# Patient Record
Sex: Female | Born: 1960 | Race: White | Hispanic: No | Smoking: Current every day smoker
Health system: Southern US, Community
[De-identification: ages and names within clinical notes are randomized; demographics above are authoritative.]

## PROBLEM LIST (undated history)

## (undated) DIAGNOSIS — M1990 Unspecified osteoarthritis, unspecified site: Secondary | ICD-10-CM

## (undated) DIAGNOSIS — F32A Depression, unspecified: Secondary | ICD-10-CM

## (undated) DIAGNOSIS — F419 Anxiety disorder, unspecified: Secondary | ICD-10-CM

## (undated) DIAGNOSIS — T8859XA Other complications of anesthesia, initial encounter: Secondary | ICD-10-CM

## (undated) DIAGNOSIS — F329 Major depressive disorder, single episode, unspecified: Secondary | ICD-10-CM

## (undated) DIAGNOSIS — T4145XA Adverse effect of unspecified anesthetic, initial encounter: Secondary | ICD-10-CM

## (undated) HISTORY — PX: DILATION AND CURETTAGE OF UTERUS: SHX78

## (undated) HISTORY — PX: FOOT SURGERY: SHX648

---

## 1998-04-04 ENCOUNTER — Other Ambulatory Visit: Admission: RE | Admit: 1998-04-04 | Discharge: 1998-04-04 | Payer: Self-pay | Admitting: Obstetrics and Gynecology

## 1998-10-24 ENCOUNTER — Inpatient Hospital Stay (HOSPITAL_COMMUNITY): Admission: AD | Admit: 1998-10-24 | Discharge: 1998-10-26 | Payer: Self-pay | Admitting: Obstetrics and Gynecology

## 1998-10-27 ENCOUNTER — Encounter (HOSPITAL_COMMUNITY): Admission: RE | Admit: 1998-10-27 | Discharge: 1999-01-25 | Payer: Self-pay | Admitting: Obstetrics and Gynecology

## 1998-11-28 ENCOUNTER — Other Ambulatory Visit: Admission: RE | Admit: 1998-11-28 | Discharge: 1998-11-28 | Payer: Self-pay | Admitting: Obstetrics and Gynecology

## 1999-01-05 ENCOUNTER — Ambulatory Visit (HOSPITAL_COMMUNITY): Admission: RE | Admit: 1999-01-05 | Discharge: 1999-01-05 | Payer: Self-pay | Admitting: Obstetrics and Gynecology

## 1999-09-07 ENCOUNTER — Emergency Department (HOSPITAL_COMMUNITY): Admission: EM | Admit: 1999-09-07 | Discharge: 1999-09-08 | Payer: Self-pay | Admitting: Emergency Medicine

## 2001-01-01 ENCOUNTER — Other Ambulatory Visit: Admission: RE | Admit: 2001-01-01 | Discharge: 2001-01-01 | Payer: Self-pay | Admitting: Obstetrics and Gynecology

## 2001-05-29 ENCOUNTER — Emergency Department (HOSPITAL_COMMUNITY): Admission: EM | Admit: 2001-05-29 | Discharge: 2001-05-29 | Payer: Self-pay | Admitting: Emergency Medicine

## 2001-05-29 ENCOUNTER — Encounter: Payer: Self-pay | Admitting: Emergency Medicine

## 2001-06-09 ENCOUNTER — Emergency Department (HOSPITAL_COMMUNITY): Admission: EM | Admit: 2001-06-09 | Discharge: 2001-06-09 | Payer: Self-pay | Admitting: Emergency Medicine

## 2001-07-31 ENCOUNTER — Inpatient Hospital Stay (HOSPITAL_COMMUNITY): Admission: EM | Admit: 2001-07-31 | Discharge: 2001-08-04 | Payer: Self-pay | Admitting: Psychiatry

## 2002-02-01 ENCOUNTER — Encounter: Admission: RE | Admit: 2002-02-01 | Discharge: 2002-02-01 | Payer: Self-pay | Admitting: Obstetrics and Gynecology

## 2002-02-01 ENCOUNTER — Encounter: Payer: Self-pay | Admitting: Obstetrics and Gynecology

## 2002-06-15 ENCOUNTER — Other Ambulatory Visit: Admission: RE | Admit: 2002-06-15 | Discharge: 2002-06-15 | Payer: Self-pay | Admitting: Obstetrics and Gynecology

## 2002-07-02 ENCOUNTER — Ambulatory Visit (HOSPITAL_COMMUNITY): Admission: RE | Admit: 2002-07-02 | Discharge: 2002-07-02 | Payer: Self-pay | Admitting: Obstetrics and Gynecology

## 2002-07-02 ENCOUNTER — Encounter (INDEPENDENT_AMBULATORY_CARE_PROVIDER_SITE_OTHER): Payer: Self-pay | Admitting: *Deleted

## 2002-09-22 ENCOUNTER — Emergency Department (HOSPITAL_COMMUNITY): Admission: EM | Admit: 2002-09-22 | Discharge: 2002-09-22 | Payer: Self-pay | Admitting: Emergency Medicine

## 2002-09-22 ENCOUNTER — Encounter: Payer: Self-pay | Admitting: Emergency Medicine

## 2002-09-30 ENCOUNTER — Ambulatory Visit (HOSPITAL_COMMUNITY): Admission: RE | Admit: 2002-09-30 | Discharge: 2002-09-30 | Payer: Self-pay | Admitting: Neurology

## 2002-12-20 ENCOUNTER — Emergency Department (HOSPITAL_COMMUNITY): Admission: EM | Admit: 2002-12-20 | Discharge: 2002-12-20 | Payer: Self-pay | Admitting: Emergency Medicine

## 2003-01-11 ENCOUNTER — Emergency Department (HOSPITAL_COMMUNITY): Admission: EM | Admit: 2003-01-11 | Discharge: 2003-01-11 | Payer: Self-pay | Admitting: Emergency Medicine

## 2003-04-25 ENCOUNTER — Inpatient Hospital Stay (HOSPITAL_COMMUNITY): Admission: EM | Admit: 2003-04-25 | Discharge: 2003-04-26 | Payer: Self-pay | Admitting: Emergency Medicine

## 2003-04-26 ENCOUNTER — Inpatient Hospital Stay (HOSPITAL_COMMUNITY): Admission: EM | Admit: 2003-04-26 | Discharge: 2003-05-01 | Payer: Self-pay | Admitting: Psychiatry

## 2004-04-01 ENCOUNTER — Emergency Department (HOSPITAL_COMMUNITY): Admission: EM | Admit: 2004-04-01 | Discharge: 2004-04-01 | Payer: Self-pay | Admitting: Emergency Medicine

## 2004-05-10 ENCOUNTER — Ambulatory Visit: Payer: Self-pay | Admitting: Psychiatry

## 2004-05-10 ENCOUNTER — Inpatient Hospital Stay (HOSPITAL_COMMUNITY): Admission: EM | Admit: 2004-05-10 | Discharge: 2004-05-17 | Payer: Self-pay | Admitting: Psychiatry

## 2004-05-10 ENCOUNTER — Encounter (HOSPITAL_COMMUNITY): Payer: Self-pay | Admitting: Psychiatry

## 2004-07-26 ENCOUNTER — Encounter: Payer: Self-pay | Admitting: Emergency Medicine

## 2004-07-27 ENCOUNTER — Inpatient Hospital Stay (HOSPITAL_COMMUNITY): Admission: EM | Admit: 2004-07-27 | Discharge: 2004-08-02 | Payer: Self-pay | Admitting: Psychiatry

## 2004-07-27 ENCOUNTER — Ambulatory Visit: Payer: Self-pay | Admitting: Psychiatry

## 2004-09-18 ENCOUNTER — Ambulatory Visit: Payer: Self-pay | Admitting: Psychiatry

## 2004-09-19 ENCOUNTER — Inpatient Hospital Stay (HOSPITAL_COMMUNITY): Admission: RE | Admit: 2004-09-19 | Discharge: 2004-09-23 | Payer: Self-pay | Admitting: Psychiatry

## 2004-11-08 ENCOUNTER — Emergency Department (HOSPITAL_COMMUNITY): Admission: EM | Admit: 2004-11-08 | Discharge: 2004-11-08 | Payer: Self-pay | Admitting: Emergency Medicine

## 2005-01-22 ENCOUNTER — Ambulatory Visit: Payer: Self-pay | Admitting: Psychiatry

## 2005-01-22 ENCOUNTER — Inpatient Hospital Stay (HOSPITAL_COMMUNITY): Admission: EM | Admit: 2005-01-22 | Discharge: 2005-01-24 | Payer: Self-pay | Admitting: Psychiatry

## 2005-03-07 ENCOUNTER — Encounter: Payer: Self-pay | Admitting: Emergency Medicine

## 2005-03-07 ENCOUNTER — Inpatient Hospital Stay (HOSPITAL_COMMUNITY): Admission: RE | Admit: 2005-03-07 | Discharge: 2005-03-11 | Payer: Self-pay | Admitting: Psychiatry

## 2005-03-08 ENCOUNTER — Ambulatory Visit: Payer: Self-pay | Admitting: Psychiatry

## 2005-06-03 ENCOUNTER — Ambulatory Visit: Payer: Self-pay | Admitting: *Deleted

## 2005-06-03 ENCOUNTER — Inpatient Hospital Stay (HOSPITAL_COMMUNITY): Admission: EM | Admit: 2005-06-03 | Discharge: 2005-06-06 | Payer: Self-pay | Admitting: *Deleted

## 2005-07-21 ENCOUNTER — Encounter: Payer: Self-pay | Admitting: Emergency Medicine

## 2005-07-21 ENCOUNTER — Inpatient Hospital Stay (HOSPITAL_COMMUNITY): Admission: EM | Admit: 2005-07-21 | Discharge: 2005-07-25 | Payer: Self-pay | Admitting: Psychiatry

## 2005-07-22 ENCOUNTER — Ambulatory Visit: Payer: Self-pay | Admitting: Psychiatry

## 2005-07-26 ENCOUNTER — Emergency Department (HOSPITAL_COMMUNITY): Admission: EM | Admit: 2005-07-26 | Discharge: 2005-07-26 | Payer: Self-pay | Admitting: Emergency Medicine

## 2006-07-27 ENCOUNTER — Emergency Department (HOSPITAL_COMMUNITY): Admission: EM | Admit: 2006-07-27 | Discharge: 2006-07-28 | Payer: Self-pay | Admitting: Emergency Medicine

## 2010-03-18 ENCOUNTER — Encounter: Payer: Self-pay | Admitting: Obstetrics and Gynecology
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
## 2010-07-13 NOTE — H&P (Signed)
   [name], [name]                        ACCOUNT NO.:  [id number]   MEDICAL RECORD NO.:  [id number]                   PATIENT TYPE:  AMB   LOCATION:  SDC                                  [facility]ACILITY:  [facility]   PHYSICIAN:  Lenoard Aden, M.D.             DATE OF BIRTH:  [birth year]   DATE OF ADMISSION:  07/02/2002  DATE OF DISCHARGE:                                HISTORY & PHYSICAL   CHIEF COMPLAINT:  A 50 year old white female (G10, P1-1-8-2) status post  tubal ligation in 2000, with worsening dysfunctional uterine bleeding,  questionable endometrial mass on saline sonohysterography performed  06/01/02.   PAST MEDICAL HISTORY:  Remarkable for five miscarriages, two abortions, one  C-section, one vaginal delivery and cryosurgery in 1985.  She has had  laparoscopic tubal ligation in 2001.  No other medical or surgical  hospitalizations.   SOCIAL HISTORY:  She is a smoker.   MEDICATIONS:  She takes no medications at this time.   PHYSICAL EXAMINATION:  GENERAL:  She is a well developed, well nourished  white female, in no apparent distress.  HEENT:  Normal.  LUNGS:  Clear.  HEART:  Regular rate and rhythm.  ABDOMEN:  Soft, nontender.  PELVIC:  Reveals an anteflexed uterus and no adnexal masses.   IMPRESSION:  Perimenopausal dysfunctional uterine bleeding, with  questionable structural mass.   PLAN:  Proceed with diagnostic hysteroscopy, resectoscopic  myomectomy/polypectomy.  The risks of anesthesia, infection, bleeding,  injury to abdominal organs and need for repair was discussed.  Delay versus  the immediate complications to include bowel or bladder injury are noted;  inability to cure bleeding is discussed.  The patient acknowledges and  wishes to proceed.                                                Lenoard Aden, M.D.    RJT/MEDQ  D:  07/02/2002  T:  07/02/2002  Job:  [id number]
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
## 2010-07-13 NOTE — Discharge Summary (Signed)
[facility]  Patient:    LAJUANNA, POMPA Visit Number: [id number] MRN: [id number]          Service Type: PSY Location: 300 0301 01 Attending Physician:  Rachael Fee Dictated by:   Reymundo Poll Dub Mikes, M.D. Admit Date:  07/31/2001 Discharge Date: 08/04/2001                             Discharge Summary  CHIEF COMPLAINT AND PRESENTING ILLNESS:  This was the first admission to [facility] for this 50 year old female, married, admitted voluntarily.  Referred after her husband found her making cuts on her left wrist.  The patient remembers little of what happened.  She remembers drinking large amounts of alcohol since age 70, sobriety during pregnancies.  She cites anxiety especially in social situations as one of her primary stressors. Reports that she does feel the need counters and clean the house frequently, almost obsessively.  Does feel frustration, hopelessness, despair regarding her inability to stop drinking.  PAST PSYCHIATRIC HISTORY:  No current outpatient treatment, has history of 2 prior detox admissions, one in 1989 at [place], one in April at [facility], has gone to AA, treated with Zoloft.  ALCOHOL AND DRUG HISTORY:  The patient did take 2 Xanax the day before the admission but does not take it routinely.  MEDICAL HISTORY:  Carpal tunnel syndrome.  MEDICATIONS:  Advil as needed.  PHYSICAL EXAMINATION:  Performed, failed to show any acute findings.  MENTAL STATUS EXAMINATION:  Reveals a petite female, generally healthy, disheveled, has considerable periorbital edema, quite puffy faced, otherwise fully alert, in no acute distress, with a blunt affect.  Cooperative, speech is normal and relevant.  Mood is anxious, depressed, hopeless, somewhat guilty about her drinking.  Thought processes are logical and goal directed.  Does minimize her alcohol use, thinks that she still runs a good household even though she  drinks.  ADMITTING  DIAGNOSES: Axis I:    1. Alcohol dependence.            2. Anxiety disorder not otherwise specified. Axis II:   No diagnosis. Axis III:  Elevated liver enzymes. Axis IV:   Moderate. Axis V:    Global assessment of function upon admission 28, highest            global assessment of function in past year 60.  LABORATORY WORK-UP:  Thyroid profile was within normal limits.  COURSE IN THE HOSPITAL:  She was admitted and started on intensive individual and group psychotherapy.  She was detoxified using phenobarbital.  She was placed on Paxil as well as Seroquel and trazodone.  She was detoxed uneventfully, did not sleep with Ambien so she was given trazodone which was more successful.  She continued to work on herself, identifying the triggers, working on a relapse prevention program, worked on the anxiety disorder.  On June 10 it was felt that she had obtained full benefit from the hospitalization, fully detoxed, committed to recovery, had tolerated Paxil well, willing and motivated to continue outpatient treatment.  DISCHARGE  DIAGNOSES: Axis I:    1. Alcohol dependence.            2. Anxiety disorder not otherwise specified. Axis II:   No diagnosis. Axis III:  Elevated liver enzymes. Axis IV:   Moderate. Axis V:    Global assessment of function upon discharge 55.  DISCHARGE MEDICATIONS: 1. Paxil CR 25 mg daily. 2.  Seroquel 25 at bedtime. 3. Trazodone 100 as needed for sleep.  DISPOSITION:  Follow up at [facility]. Dictated by:   Reymundo Poll Dub Mikes, M.D. Attending Physician:  Rachael Fee DD:  09/02/01 TD:  09/05/01 Job: [id number]

## 2010-07-13 NOTE — Op Note (Signed)
   [name], [name]                        ACCOUNT NO.:  [id number]   MEDICAL RECORD NO.:  [id number]                   PATIENT TYPE:  AMB   LOCATION:  SDC                                  [facility]ILITY:  [facility]   PHYSICIAN:  Lenoard Aden, M.D.             DATE OF BIRTH:  08/08/1960   DATE OF PROCEDURE:  07/02/2002  DATE OF DISCHARGE:                                 OPERATIVE REPORT   PREOPERATIVE DIAGNOSIS:  Perimenopausal dysfunctional uterine bleeding with  questionable structural endometrial lesion.   POSTOPERATIVE DIAGNOSIS:  Just fibroids x2.   OPERATION/PROCEDURE:  1. Diagnostic hysteroscopy.  2. Resectoscopic myomectomy.  3. Dilatation and curettage.   SURGEON:  Lenoard Aden, M.D.   ANESTHESIA:  General.   ESTIMATED BLOOD LOSS:  Less than 50 mL.   COMPLICATIONS:  None.   DRAINS:  None.   COUNTS:  Correct.   CONDITION:  The patient went to recovery in good condition.   SPECIMENS:  Endometrial curettings and submucous fibroids to pathology.   DESCRIPTION OF PROCEDURE:  After being apprised of the risks of anesthesia,  infection, bleeding, uterine perforation, possible injury to abdominal  organs and need for repair, the patient was brought to the operating room  where she was administered general anesthesia without complications, prepped  and draped in the usual sterile fashion, catheterized until the bladder was  empty.  Examination under anesthesia revealed a small anteflexed uterus and  no adnexal masses.  Dilute Pitressin solution 10 in 100 mL was placed at 3  and 9 o'clock at the cervicovaginal junction, approximately 16 mL total.  The uterus is easily dilated up to a #33 Pratt dilator and hysteroscope  placed.  Visualization reveals anterior bulging and thickening of the  anterior uterine wall __________ the fundus and posterior wall as well.  These areas were resected down to the myometrium hemostatically using a  double angle  loop.  Bilateral  tubal ostia are visualized.  No evidence of further polyps  or endometrial masses at this time.  D&C is performed.  Re-visualization  reveals an empty cavity.  All instruments are removed.  Deficit of 32 mL.  The patient tolerated the procedure well and was transferred to recovery in  good condition.                                               Lenoard Aden, M.D.    RJT/MEDQ  D:  07/02/2002  T:  07/03/2002  Job:  [id number]
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
## 2010-07-13 NOTE — Discharge Summary (Signed)
[name], [name]              ACCOUNT NO.:  [id number]   MEDICAL RECORD NO.:  [id number]          PATIENT TYPE:  IPS   LOCATION:  0303                          [facility]  [facility]   PHYSICIAN:  Anselm Jungling, MD  DATE OF BIRTH:  1960-12-06   DATE OF ADMISSION:  07/21/2005  DATE OF DISCHARGE:  07/25/2005                                 DISCHARGE SUMMARY   IDENTIFYING DATA AND REASON FOR ADMISSION:  The patient is a 50 year old  single white female admitted for treatment of alcohol dependence and mood  disorder.  Her last alcohol intake was on the day prior to admission, when  she drank quite heavily.  Upon admission she felt agitated, and tremulous.  She had recently been on a psychotropic regimen including Lexapro, Lamictal,  Seroquel and Campral, but had stopped taking these 2-3 weeks prior to  admission, because she had started drinking again and thought it was  dangerous to combine of.  She had been hospitalized here psychiatrically  last year for similar reasons.  Please refer to the admission note for  further details pertaining to symptoms, circumstances and history that led  to her hospitalization.  She was given an initial Axis I diagnoses of  alcohol dependence, and history of mood disorder.   MEDICAL AND LABORATORY:  The patient was medically and physically assessed  by the psychiatric nurse practitioner.  She is essentially in good health  without any active or chronic medical problems.  There were no significant  medical issues during this brief inpatient psychiatric stay.   HOSPITAL COURSE:  The patient was admitted to the adult inpatient  psychiatric service.  She was placed on a Librium detoxification protocol.  She was restarted on her usual Lexapro, Lamictal, Seroquel and Campral.  However, during the course of her stay, we determined that Seroquel was not  appearing to be necessary, and it was discontinued.  She tolerated her  medications well.  The  detoxification proceeded uneventfully.   During her stay, she verbalized strongly her desire to get into some form of  solid 12-step recovery on a lifetime basis.   She and her family worked together to try to identify a long-term  residential treatment program called [facility], that she hopes to be able  to enter following her discharge.   Her mood was moderately depressed, but appropriate to her situation and she  was absent suicidal ideation during her entire inpatient stay.   She appeared to be complete with respect to the alcohol detoxification  process by the fifth hospital day and was discharged.   AFTERCARE:  The patient was to follow-up for outpatient treatment at the  [facility] with an appointment on the day following discharge, July 26, 2005.  In addition, at the time of discharge she was making application to  the [facility] residential program.   DISCHARGE MEDICATIONS:  Campral 666 mg t.i.d., Lexapro 20 mg daily, and  Lamictal 25 mg daily.   DISCHARGE DIAGNOSES:  AXIS I: Alcohol dependence, early remission,  mood  disorder not otherwise specified.  AXIS II: Deferred.  AXIS III: No acute or chronic illnesses.  AXIS IV: Stressors severe.  AXIS V: Global assessment of functioning on discharge 65.           ______________________________  Anselm Jungling, MD  Electronically Signed     SPB/MEDQ  D:  07/26/2005  T:  07/26/2005  Job:  [id number]
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
## 2010-07-13 NOTE — H&P (Signed)
[facility]  Patient:    [name], [name] Visit Number: [id number] MRN: [id number]          Service Type: PSY Location: 300 0301 01 Attending Physician:  Rachael Fee Dictated by:   Young Berry Scott, R.N. N.P. Admit Date:  07/31/2001                     Psychiatric Admission Assessment  DATE OF ADMISSION:  July 31, 2001  DATE OF ASSESSMENT:  July 31, 2001, at 8 a.m.  PATIENT IDENTIFICATION:  This is a 50 year old Caucasian female who is married, voluntary admission.  HISTORY OF PRESENT ILLNESS:  This patient was referred by the emergency room after her husband found her making cuts on her left wrist.  The patient remembers little of what happened yesterday.  She reports drinking large amounts of alcohol since age 55 with sobriety during her pregnancies.  She cites anxiety, especially in social situations, as one of her primary stressors and reports that she does feel the need to clean counters and clean the house frequently, almost obsessively, but denies any ritual behaviors. She endorses frustration, hopelessness, and despair regarding her inability to stop drinking.  Typically, she begins drinking at 4 oclock in the morning and sips alcohol constantly all day long until she passes out at night and then awakens at 4 oclock in the morning feeling anxious and sweaty.  The patient is also concerned about her ability to parent effectively, recognizing that she is not always there for her 81-year-old and 22-year-old daughters as she needs to be.  She denies any homicidal ideation, denies any auditory or visual hallucinations.  She persists with some vague suicidal ideation but with no specific intent or plan.  PAST PSYCHIATRIC HISTORY:  The patient has no current outpatient treatment. She has a history of two prior detoxification admissions, one in 1989 at the [facility], and one in April 2003 at [facility].  She has attended [facility] in the past but has never obtained a sponsor and has not attended consistently.  She was treated with Zoloft for her anxiety at one point in the past but reports that she was drinking so much at the time, she is not sure if it was effective or not, and then she stopped it on her own. Her longest period sober since age 7 is one and one half years.  The patient denies any other substance abuse and denies any previous history of suicide attempts.  She did take some Xanax yesterday which she obtained from her sister and took that in addition with the alcohol and passed out; hence, she has little memory of what happened yesterday.  SUBSTANCE ABUSE HISTORY:  Noted above.  The patient did take two Xanax, she reports, yesterday but does not routinely use benzodiazepines.  PAST MEDICAL HISTORY:  The patient is followed by Dr. Billy Coast who is her obstetrician at [facility].  Medical problems include carpal tunnel syndrome in her right wrist.  Past medical history is remarkable for no hospitalizations.  Surgeries include a bilateral tubal ligation in 2000, a cesarean section once, and some toe surgery.  She denies any past history of severe head trauma, no history of seizures.  Does have positive history of blackouts.  MEDICATIONS:  Advil as needed for her carpal tunnel syndrome.  DRUG ALLERGIES:  None.  PHYSICAL EXAMINATION:  GENERAL:  The patients physical examination was negative, done in the emergency room by Dr. Mancel Bale.  VITAL SIGNS:  On admission to the unit: Temperature 98.5, pulse 104, respirations 70, blood pressure 139/88.  She was 116 pounds on admission and is approximately 5 feet 2 inches tall.  LABORATORY DATA:  Review of labs reveal that her CBC is normal with WBC 5.8, hemoglobin 13.4, hematocrit 39.1, MCV 98.2, RDW 13.3, platelets 227. Chemistry reveals sodium slightly elevated at 147, potassium 3.8.  Other lytes are normal; BUN 9, creatinine 0.7.  Total  bilirubin is 0.5, SGOT elevated at 46, SGPT at 35.  Urine pregnancy test is negative.  Urine drug screen: Positive for benzodiazepines.  Alcohol level in the emergency room was initially 355 on admission, later decreased eight hours later was 153. Urinalysis is normal.  SOCIAL HISTORY:  The patient is originally from [place], [place]; most recently moved here in 1999 to be near additional family members.  She has a high school education and one of community college.  She works as a Futures trader.  She has been married for 12 years.  She has 56-year-old and 61-year-old daughters. She currently lives at home with her husband and two daughters in [place]. Her parents and siblings live nearby and are generally supportive.  FAMILY HISTORY:  Brother with a history of alcohol abuse and a sister with a history of benzodiazepine abuse.  MENTAL STATUS EXAMINATION:  This is a petite female who is generally healthy in appearance.  She is disheveled and has considerable periorbital edema and quite a puffy face but otherwise is fully alert in no acute distress with a blunted affect and a subdued manner.  She is cooperative.  Speech is normal and relevant.  Mood is anxious, depressed, hopeless, and somewhat guilty about her drinking.  Thought process is logical and goal directed.  She does minimize her ETOH use and states that she still runs a good household even though she drinks and only sips.  She has a considerable denial component and finds it difficult to believe that detoxification is going to take four or five days.  Cognitive: Intact and oriented x 3.  Intelligence is average to above average.  Insight: Poor.  Impulse control and judgment: Fair.  ADMISSION DIAGNOSES: Axis I:    1. Ethyl alcohol abuse and dependence.            2. Anxiety disorder, not otherwise specified. Axis II:   Deferred. Axis III:  Elevated liver enzymes. Axis IV:   Moderate to severe problems with the primary support  group, being            the impact of ethyl alcohol on her marriage and on her parenting.  Axis V:    Current 28, past year 78.  INITIAL PLAN OF CARE:  Plan is to voluntarily admit the patient to stabilize her and detoxify her from alcohol with a goal of alleviating her suicidal ideation, giving her a safe detoxification within five days, and alleviating her intense feelings of anxiety to allow her to function in her previous activities of daily living.  We are going to ask the case manager to schedule a family session with her husband to enlist his support and hear his concerns. We have elected to start her on a phenobarbital protocol and she was given a loading dose of 260 mg and will be on a tapering oral dose from there.  She will also receive multivitamin and thiamine daily.  We are going to check a thyroid panel on her since that has not been done and she  does have a family history of thyroid disorder.  We will consider starting her on Paxil 12.5 mg daily.  ESTIMATED LENGTH OF STAY:  Six days. Dictated by:   Young Berry Scott, R.N. N.P. Attending Physician:  Rachael Fee DD:  07/31/01 TD:  07/31/01 Job: [phone]
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
## 2010-07-13 NOTE — H&P (Signed)
[name], DEGNER NO.:  [id number]   MEDICAL RECORD NO.:  [id number]          PATIENT TYPE:  IPS   LOCATION:  0501                          FACILITY:  [facility]   PHYSICIAN:  Jeanice Lim, M.D. DATE OF BIRTH:  [birth year]   DATE OF ADMISSION:  03/07/2005  DATE OF DISCHARGE:                         PSYCHIATRIC ADMISSION ASSESSMENT   IDENTIFYING INFORMATION:  This is a 50 year old widowed white female  voluntarily admitted on March 07, 2005.   HISTORY OF PRESENT ILLNESS:  The patient presents with a history of alcohol  dependence.  The patient has been drinking a fifth of liquor for the past  few days.  The patient states she fell and sustained a laceration to her  eyebrow.  Her mother was very concerned and wanted her to be detoxed.  The  patient has had multiple detoxes prior.  The patient feels that she is a  burden to her family.  Her mother has had recent open heart surgery.  The  patient currently lives with the mother.  The patient's two children also  stay with her parents.  The patient also has been using Xanax.  She has been  noncompliant with the medications.  She was reporting some side effects,  specifically diarrhea with her Lexapro. The patient states that she knows  what she needs to do in regards to her alcohol intake.  She is currently  denying any suicidal ideation.   PAST PSYCHIATRIC HISTORY:  The patient has had several admissions to  [facility] for alcohol abuse/dependence.  The patient was here  in October of 2006 and was attending the [facility] for follow-up care.   SOCIAL HISTORY:  This is a 50 year old widowed white female.  She has two  children.  Husband had hung himself several years ago.  She has a court date  pending on March 19, 2005 for her third DUI.  Feels that she may be going  to prison for this DUI.   FAMILY HISTORY:  Unclear.   ALCOHOL/DRUG HISTORY:  The patient also reports a history of seizures  and  has been using Xanax.   PRIMARY CARE PHYSICIAN:  Unclear.   MEDICAL PROBLEMS:  None.   MEDICATIONS:  Has been using Xanax, possible 1 mg q.i.d.  Was on Lexapro and  Seroquel.  Again, has been noncompliant.  The patient was on Seroquel 200 mg  at bedtime.   ALLERGIES:  No known allergies.   PHYSICAL EXAMINATION:  The patient was assessed at [facility].  She does present with a laceration to her left eyebrow, which  was stitched.  There was no bleeding or redness noted.  She is not showing  any signs of tremors at this time.  Her temperature is 97.8, respiratory  rate 12, blood pressure 123/77.   LABORATORY DATA:  Her alcohol level, on admission to the emergency room, was  360 down to 292 prior to transfer.  Urine drug screen was positive for  benzodiazepines.  TSH is 2.913.  CMET is within normal limits.   MENTAL STATUS EXAM:  She is fully  alert, cooperative, lying in the bed.  She  has fair eye contact.  Casually dressed.  Her speech is clear, normal pace  and tone.  The patient feels very frustrated and hopeless over her  situation.  Her affect is flat, although polite and pleasant.  Thought  processes are coherent.  No evidence of psychosis.  Cognitive function is  intact.  Memory is good.  Judgment is poor.  Insight is poor.  Poor impulse  control.   DIAGNOSES:  AXIS I:  Major depressive disorder, recurrent.  Rule out alcohol-  induced mood disorder.  Alcohol dependence.  Rule out bipolar disorder.  AXIS II:  Deferred.  AXIS III:  None.  AXIS IV:  Severe (psychosocial stressors related to legal system,  psychosocial problems related to grief, problems with primary support group,  housing).  AXIS V:  Current 30; past year 70.   PLAN:  Detox the patient.  Work on relapse prevention.  Discussion of Xanax  was done with the patient.  The patient is agreeable and aware that we will  not be resuming her Xanax.  We will encourage fluids.  The patient  is to  attend all individual and group therapy.  The patient will possibly need a  long-term rehab program to be medication compliant.  Will consider having a  family session with parents.   TENTATIVE LENGTH OF STAY:  Four to six days.      Landry Corporal, N.P.      Jeanice Lim, M.D.  Electronically Signed    JO/MEDQ  D:  03/08/2005  T:  03/08/2005  Job:  [id number]
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
## 2010-07-13 NOTE — H&P (Signed)
[name], [name]                        ACCOUNT NO.:  [id number]   MEDICAL RECORD NO.:  [id number]                   PATIENT TYPE:  IPS   LOCATION:  0506                                 FACILITY:  [facility]   PHYSICIAN:  Geoffery Lyons, M.D.                   DATE OF BIRTH:  1960/07/21   DATE OF ADMISSION:  04/26/2003  DATE OF DISCHARGE:                         PSYCHIATRIC ADMISSION ASSESSMENT   IDENTIFYING INFORMATION:  The patient is a 50 year old married white female  voluntarily admitted on April 26, 2003.   HISTORY OF PRESENT ILLNESS:  The patient presents with a history of a  suicide attempt where the patient overdosed on Xanax, Seroquel, trazodone,  and codeine.  She believes it was this past weekend on Saturday.  The  patient is a transfer from [facility] after the patient was admitted for  overdose.  The patient's intention with her overdosed was that she was  hoping to end the pain.  She has been having conflict with her husband.  She states her intention was to end her life.  She states she also left a  note to all.  The patient states she has been abusing alcohol, drinking  all day, sipping on alcohol throughout the day.  Stressors: The patient  states her husband has been gambling.  Her sleep has been decreased.  Her  appetite has been decreased.  She denies any psychotic symptoms.  The  patient is motivated to stay well this time and not relapse on alcohol.   PAST PSYCHIATRIC HISTORY:  This is the second hospitalization at [facility].  She has been detoxified five to six times in the past.  She  was in a 28-day program at [facility].  Her last admission to  [facility] was in June 2003 for similar complaints.  She was  seeing a psychiatrist at [facility], Dr. Jeanie Sewer.  She was hospitalized at [facility] in the past for a suicide attempt in November 2004 where  she overdosed on several pills and had a five day hospital stay.   SUBSTANCE ABUSE HISTORY:  The patient smokes.  She drinks vodka from  morning to night for the past 30 years.  She began drinking around the age  of 49.  Longest history of sobriety has been a year and a half.  The patient  reports positive blackouts, no seizure activity.   PAST MEDICAL HISTORY:  Primary care Lorriann Hansmann: Lenoard Aden, M.D.  Medical problems: None.   MEDICATIONS:  She is taking none currently.  She has been on Zoloft,  Effexor, Lexapro, and Paxil in the past.   PHYSICAL EXAMINATION:  GENERAL:  Physical examination was done while the  patient was admitted for overdose at [facility], which was reviewed.  The  patient is a well nourished female, petite.  VITAL SIGNS:  Vital signs are stable.  Temperature 98.2, heart rate  74,  respirations 18, blood pressure 105/67.  NEUROLOGIC:  Nonfocal neurologic findings, no tremors.   LABORATORY DATA:  CBC is within normal limits.  Potassium is 3.7.  Blood  sugar is 103.  AST is elevated at 50.  Urine drug screen was positive for  benzodiazepines, positive for opiates.  CT scan of the head was negative.   SOCIAL HISTORY:  She is a 50 year old married white female married for 14  years, second.  She has two children ages 51 and 70.  She lives with her  husband and children.  She stays at home.  She has two DUIs.  No current  legal problems.   FAMILY HISTORY:  Grandmother and grandfather on the mother's side with a  history of alcohol.  Her uncle also has problems with alcohol.   MENTAL STATUS EXAM:  She is an alert, middle-aged female, cooperative, fair  eye contact.  Speech is clear.  The patient feels sad, nervous, and scared  today.  Affect is anxious.  Thought processes are coherent; no evidence of  psychosis.  Cognitive functioning: Intact.  Memory is good.  Judgment is  poor.  Insight is limited.   ADMISSION DIAGNOSES:   AXIS I:  1. Major depressive disorder.  2. Alcohol dependence.  3. Rule out bipolar disorder.    AXIS II:  Deferred.   AXIS III:  None.   AXIS IV:  Problems with primary support group, other psychosocial problems  related to parenting issues.   AXIS V:  Current is 35, this past year is 79.   INITIAL PLAN OF CARE:  Plan is a voluntary admission to [facility] for intentional overdose and alcohol dependence.  Contract for  safety.  Will stabilize and detoxify with Librium protocol.  Will encourage  fluids.  Will initiate antidepressant.  Medication compliance was discussed.  Have Seroquel available for sleep and mood lability.  Will have a family  session with husband for support.  Discussion of tobacco use and alcohol  abuse was done with the patient.  Relapse prevention while the patient is  hospitalized.  The patient is to follow up with AA and to remain alcohol  free.   ESTIMATED LENGTH OF STAY:  Four to five days.     Landry Corporal, N.P.                       Geoffery Lyons, M.D.    JO/MEDQ  D:  04/27/2003  T:  04/27/2003  Job:  [id number]

## 2010-07-13 NOTE — Discharge Summary (Signed)
NAMEKI, LUCKMAN                        ACCOUNT NO.:  [id number]   MEDICAL RECORD NO.:  [id number]                   PATIENT TYPE:  IPS   LOCATION:  0506                                 FACILITY:  [facility]   PHYSICIAN:  Geoffery Lyons, M.D.                   DATE OF BIRTH:  1960/09/01   DATE OF ADMISSION:  04/26/2003  DATE OF DISCHARGE:  05/01/2003                                 DISCHARGE SUMMARY   CHIEF COMPLAINT AND PRESENT ILLNESS:  This was the second admission to [facility] for this 49 year old married white female voluntarily  admitted.  History of suicide attempt where the patient overdosed on Xanax,  Seroquel, trazodone and codeine the past weekend, on Saturday.  Transferred  from [facility] after the patient was admitted for an overdose, hoping to  end the pain.  Having conflict with her husband.  The intention was to end  her life.  Also left a note to all.  Has been abusing alcohol, drinking  all day, sipping on alcohol throughout the day.  Her husband has been  gambling.  Has had decreased sleep, decreased appetite, no psychotic  symptoms.   PAST PSYCHIATRIC HISTORY:  Second time at [facility].  Detoxified 5-6  times in the past, 28-day program at [facility].  She was admitted in  June of 2003 at [facility] for similar complaint.  Seen a  psychiatrist at [facility], Dr. Betti Cruz.  Hospitalized at [facility] in  the past.   ALCOHOL/DRUG HISTORY:  Drinks vodka from morning until night, for the past  30 years, drinking around age 50.  Longest history of sobriety a year and a  half.  Blackouts.  No seizure activities.   PAST MEDICAL HISTORY:  Denies history of any major medical condition.   MEDICATIONS:  None currently.  Has been on Zoloft, Effexor, Lexapro and  Paxil.   PHYSICAL EXAMINATION:  Performed and failed to show any acute findings.   LABORATORY DATA:  CBC within normal limits.  Potassium 3.7.  Blood sugar  103.  SGOT 50.   Urine drug screen positive for benzodiazepines and opiates.  CT scan of head was negative.   MENTAL STATUS EXAM:  Alert, middle-aged, female.  Cooperative.  Fair eye  contact.  Speech was clear.  The patient feels sad, nervous, and scared.  Affect was anxious.  Thought processes are coherent.  No evidence of  psychosis.  Cognition well-preserved.   ADMISSION DATE:   AXIS I:  1. Major depressive disorder.  2. Alcohol dependence.   AXIS II:  No diagnosis.   AXIS III:  No diagnosis.   AXIS IV:  Moderate.   AXIS V:  Global Assessment of Functioning upon admission 35; highest Global  Assessment of Functioning in the last year 65.   HOSPITAL COURSE:  She was admitted and started intensive individual and  group psychotherapy.  She was detoxified with Librium.  She was given some  Seroquel and started on Zoloft.  Zoloft was increased to 25 mg twice a day.  She was given Seroquel 25 mg in the morning and 100 mg at night.  Then she  was started on Wellbutrin XL 150 mg in the morning and Seroquel was  increased to 150 mg at bedtime.  Endorsed persistent depression as well as  alcohol dependence.  Claimed that when she was still sober, she was still  depressed.  Sense of hopelessness, helplessness, ___________ also an issue.  She did tolerate the Zoloft, started the Wellbutrin.  She claimed that the  depression set the stage for the relapse.  Endorsing overwhelmed.  There was  some underlying withdrawal.  Family session with the husband.  She was going  to return home and follow up with [facility].  She overall felt better.  The next  two days, she got more stable.  Her mood improved.  Her affect became  brighter.  On May 01, 2003, she was in full contact with reality.  Appears  depressed, mild range affect.  No suicidal ideation.  No homicidal ideation.  No psychosis.  Cognition well-preserved.   DISCHARGE DIAGNOSES:   AXIS I:  1. Major depression.  2. Alcohol dependence.   AXIS II:  No  diagnosis.   AXIS III:  No diagnosis.   AXIS IV:  Moderate.   AXIS V:  Global Assessment of Functioning upon discharge 50.   DISCHARGE MEDICATIONS:  1. Zoloft 50 mg per day.  2. Wellbutrin XL 150 mg in the morning.  3. Seroquel 100 mg, 1/2 in the morning and 1-1/2 at bedtime.   FOLLOW UP:  [facility].                                               Geoffery Lyons, M.D.    IL/MEDQ  D:  05/25/2003  T:  05/28/2003  Job:  [id number]
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
## 2010-07-13 NOTE — Discharge Summary (Signed)
[name], [name] NO.:  [id number]   MEDICAL RECORD NO.:  [id number]          PATIENT TYPE:  IPS   LOCATION:  0306                          FACILITY:  [facility]   PHYSICIAN:  Jeanice Lim, M.D. DATE OF BIRTH:  September 11, 1960   DATE OF ADMISSION:  01/22/2005  DATE OF DISCHARGE:  01/24/2005                                 DISCHARGE SUMMARY   IDENTIFYING DATA:  This is a 50 year old single Caucasian female,  voluntarily admitted, [facility]  with alcohol dependence and depression.  She reported remaining sober  throughout the summer for 3 months after her last admission on July 26  through 30, then gradually resumed sipping at alcohol, thinking that she  was able to control it, depressed secondary to death of husband and was  facing a court date.  History of 3 DWIs, currently charged with driving  while license revoked.  Compulsive shopping at times, history of some mood  instability.   PAST PSYCHIATRIC HISTORY:  Had counseling at [facility], fifth [facility] admission, positive history of overdosing and self mutilation.  Period of sobriety was quite impressive for the patient.  She clearly made  progress after her last discharge despite this relapse and her current state  at the time of admission.   MEDICATIONS:  Xanax and Lexapro, which were discontinued.  Had been using  Xanax 4 mg a day, 2 mg in morning and at night, and Seroquel 100 mg daily.   DRUG ALLERGIES:  CODEINE.   PHYSICAL AND NEUROLOGICAL EXAMINATION:  Within normal limits.  The patient  did complain of loose stools, upset stomach, muscle pain, and rapid  thoughts.   MENTAL STATUS EXAM:  Fully alert, fine tremor, anxious, decreased  productivity, soft tone, tremulous, anxious, fidgety, feeling guilty with  lots of shame regarding relapse in drinking, although proud of her period of  sobriety, which was validated.  Thought process distracted,  anxious, passive  suicidal ideation, cognitively intact.  Judgment and insight impaired.   ADMISSION DIAGNOSES:  AXIS I:  Major depressive disorder, recurrent, severe,  rule out bipolar II disorder, and alcohol dependence, possible alcohol-  induced mood disorder superimposed on bipolar II disorder.  AXIS II:  Deferred.  AXIS III:  None.  AXIS IV:  Severe stressors with legal issues related to DWIs, financial  stress and limited support system.  AXIS V:  30/55-60.   HOSPITAL COURSE:  The patient was admitted and ordered routine p.r.n.  medications, underwent further monitoring, and was encouraged to participate  in individual, group and milieu therapy, was initially started on Risperdal  for racing thoughts, pressured speech, and Librium detox protocol.  The  patient was monitored for safety and was optimized on medications and  discharged in improved condition with no acute withdrawal symptoms,  significantly appearing to have improved mood, improved insight regarding  the importance of relapse prevention plan and sobriety, and was given  medication education and discharged on:  1.  Librium 25 mg at 8 p.m. for 1 day and then stop.  2.  Trazodone 100 mg  at 8 p.m.  3.  Paxil CR 12.5 mg in the morning.   The patient was to follow up at the [facility] with Dr. Gwyndolyn Kaufman and Hal Neer, where she had greatly benefitted from and this was in part the  reason that she was able to remain sober for the past 3 months.  She is on  some form of probation which requires treatment and the fact that she  relapsed made her hesitant to inform the [facility] because she thought  this might extend her probation period.  However, the patient was advised  and encouraged to let the [facility] know and be open since she was doing  so much better and she clearly was benefiting from going to the [facility] and it would not hurt her to continue to have to go since she has a  very serious  addiction which makes her dangerous when she relapses.  The  patient was discharged in improved condition with no risk issues, with  improved judgment and insight.  Prognosis is much better than it was a year  ago and there are no risk issues at the time of discharge.   DISCHARGE DIAGNOSES:  AXIS I:  Major depressive disorder, recurrent, severe,  rule out bipolar II disorder, and alcohol dependence, possible alcohol-  induced mood disorder superimposed on bipolar II disorder.  AXIS II:  Deferred.  AXIS III:  None.  AXIS IV:  Severe stressors with legal issues related to DWIs, financial  stress and limited support system.  AXIS V:  Global assessment of functioning on discharge was 55-60.      Jeanice Lim, M.D.  Electronically Signed     JEM/MEDQ  D:  02/03/2005  T:  02/03/2005  Job:  [id number]

## 2010-07-13 NOTE — Discharge Summary (Signed)
NAMELILLE, KARIM NO.:  [id number]   MEDICAL RECORD NO.:  [id number]          PATIENT TYPE:  IPS   LOCATION:  0305                          FACILITY:  [facility]   PHYSICIAN:  Jeanice Lim, M.D. DATE OF BIRTH:  Jan 08, 1961   DATE OF ADMISSION:  05/10/2004  DATE OF DISCHARGE:  05/17/2004                                 DISCHARGE SUMMARY   IDENTIFYING DATA:  This 50 year old widowed Caucasian female was voluntarily  admitted, presenting with a history of alcohol abuse, drinking approximately  1/2 gallon of vodka daily.  She had been falling recently, fell twice a  week, hit her head.  She had a CAT scan which was negative.  She reported  falling over the past several days and had bruising.  She denies depression  or suicidal thoughts.  She reported a history of mood swings, decreased  sleep, sleeping 2 hours at a time and had lost 15 pounds, family very  concerned about her, coming to her home almost on a daily basis, taking her  children to school.  Patient had been admitted here at [facility] 2 years ago.  She sees Dr. Merla Riches.  Longest history of sobriety  was 90 days when she was in an AA program and then [facility] prior to that.  History of suicide attempts x 3, overdosing, cutting writs, had been  hospitalized at [facility].   MEDICATIONS:  Lexapro and Xanax prescribed by Dr. Merla Riches.   DRUG ALLERGIES:  CODEINE.   PHYSICAL EXAMINATION:  Physical and neurologic exam within normal limits.   ROUTINE ADMISSION LABORATORIES:  Within normal limits.  Alcohol level was  initially 371.  SGOT slightly elevated at 49.  TSH within normal limits.   MENTAL STATUS EXAM:  A fully alert female, cooperative.  Fair eye contact.  Multiple abrasions, bruises.  Speech was clear and normal rate.  Patient  felt very tired.  Affect flat.  Thought process was goal directed, no  evidence of psychotic symptoms.  Cognitively intact.  Judgment and insight  were limited with  a history of poor impulse control.   ADMISSION DIAGNOSES:   AXIS I:  Alcohol dependence, rule out depressive disorder, not otherwise  specified versus alcohol-induced mood disorder.   AXIS II:  Deferred.   AXIS III:  None.   AXIS IV:  Psychosocial sequela related to chronic alcohol use and grief.   AXIS II:  30/55.   HOSPITAL COURSE:  Patient was admitted, ordered routine p.r.n. medications,  underwent further monitoring, was encouraged to participate in individual,  group and milieu therapy.  Patient was monitored and participated in  substance abuse treatment, developed a relapse prevention plan and was  educated regarding ___________ and Campral as possibilities.  Patient was  monitored and safely detoxed.  Family was involved as possible for aftercare  planning.  CT scan again was obtained due to frequent falls since the last  head CT.  Patient required extra Librium for p.r.n. withdrawal symptoms and  was then optimized on medications including Celexa targeting depressive  symptoms, Neurontin for significant anxiety as she was detoxed  off of Xanax.  CWAL had to be done every 4 hours initially.  Patient did gradually improve  and was discharged in improved condition with no withdrawal symptoms.  Mood  was more stable.  Affect brighter.  Thought processes goal directed,  reporting understanding the importance of no benzodiazepines, no alcohol,  and being compliant with the aftercare plan.  Family is also comfortable  with aftercare plan.  Patient was given medication education again including  risk of rash and metabolic issues associated with Seroquel.   DISCHARGE MEDICATIONS:  1.  Lamictal 25 mg q.a.m.  2.  Celexa 20 mg 1/2 q.a.m.  3.  Neurontin 300 mg b.i.d. and 2 at 8:00 p.m.  4.  Seroquel 25 mg t.i.d. and 2 at 8:00 p.m.  5.  Seroquel 200 mg at 8:00 p.m.  6.  Protonix 40 mg q.a.m.   FOLLOW UP:  Patient was to follow up at [facility] and hospice for grief   counseling, to call Monday, May 21, 2004, to confirm and to follow up at  [facility] on May 24, 2004 at 9:00 a.m.   DISCHARGE DIAGNOSES:   AXIS I:  Alcohol dependence, rule out depressive disorder, not otherwise  specified versus alcohol-induced mood disorder.   AXIS II:  Deferred.   AXIS III:  None.   AXIS IV:  Psychosocial sequela related to chronic alcohol use and grief.   AXIS II:  Global assessment of function on discharge was 55.      JEM/MEDQ  D:  06/09/2004  T:  06/09/2004  Job:  [id number]

## 2010-07-13 NOTE — H&P (Signed)
NAMEADRIEL, DESROSIER NO.:  [id number]   MEDICAL RECORD NO.:  [id number]         PATIENT TYPE:  BIPS   LOCATION:  305-2                         [facility]ILITY[facility]  [facility]   PHYSICIAN:  Geoffery Lyons, M.D.      DATE OF BIRTH:  07/11/1960   DATE OF ADMISSION:  05/10/2004  DATE OF DISCHARGE:                         PSYCHIATRIC ADMISSION ASSESSMENT   IDENTIFYING INFORMATION:  A 50 year old, widowed, white female voluntarily  admitted on 05/09/2004.   HISTORY OF PRESENT ILLNESS:  The patient presents with a history of alcohol  abuse.  She has been drinking approximately 1/2 gallon of vodka daily.  She  reports that she has been falling recently.  She fell twice, about a week  ago and then hit her head. She had a CAT scan, at that time which was  negative.  The patient also reports falling over the past several days as  well. Hitting her head, chest and extremities.  The patient has been  drinking for years.  She denies any depression, suicidal thoughts. She  reports a history of mood swings, of decreased sleep (only sleeping about 2-  3 hours at a time).  Appetite has been satisfactory.  The patient has lost  about 15 pounds.  The patient reports that her family is very concerned  about her.  Her father has been coming to her home almost on a daily basis  taking her children to school.   PAST PSYCHIATRIC HISTORY:  The second admission to [facility].  She was here approximately 2 years ago.  She sees Dr. Merla Riches in  [place].  The longest history of sobriety has been approximately 90 days;  and that was when she was in the AA program.  She was in the [facility] program  prior to that.  She reports suicide attempts x3, overdosing and cut her  wrists; and has been hospitalized at [facility].   SOCIAL HISTORY:  She is a 50 year old, widowed, white female.  She has 2  children ages 8 and 65.  Children are currently with her parents.  She is a  Engineer, agricultural, currently  unemployed.  No legal charges. The patient  otherwise lives with her children.  She reports her husband comitted  suicide, he hanged himself in 2005.   FAMILY HISTORY:  A sister who is bipolar, brother with depression.   ALCOHOL AND DRUG HISTORY:  The patient smokes. She drinks in the morning.  Reports positive falls, positive black outs, no seizures, no history of any  drug use.   PRIMARY CARE Storm Dulski:  None.   MEDICAL PROBLEMS:  None.   MEDICATIONS:  1.  Lexapro 20 mg.  2.  Xanax 1 mg in the morning and 1 mg in the afternoon and 2 mg at bedtime,      prescribed by Dr. Merla Riches.   DRUG ALLERGIES:  CODEINE.   PHYSICAL EXAMINATION:  GENERAL:  The patient was assessed at [facility] ED.  This is a well-nourished female in no acute distress.  The patient was  having tremors on admission. She has multiple abrasions to her chin, chest,  and  which also has some mild swelling noted abrasions and bruising on her  extremities.  NEUROLOGIC:  Neurological findings are intact.  There are no tremors noted  at this time.  VITAL SIGNS:  Her temperature is 97.6, 60 heart rate, 18 respirations, blood  pressure is 110/76; 160 pounds, 5 feet 2 inches tall.   LABORATORY DATA:  Alcohol level on admission to the emergency room was 371,  down to 294 at time of transfer.  SGOT is elevated at 49, CBC is within  normal limits.  TSH is 2.283.   MENTAL STATUS EXAM:  A fully alert, female, in the bed, cooperative, fair  eye contact with multiple abrasions and bruises noted.  Speech is clear.  Normal rate and tone.  The patient feels very tired.  Her affect is flat.  Dopplers are coherent, no evidence of psychosis.  Cognitive function intact.  Memory is fair, judgment is limited, insight is partial, poor __________  control.   DIAGNOSES   AXIS I:  1.  Alcohol dependence.  2.  Rule out depression disorder, not otherwise specified.   AXIS II:  Deferred.   AXIS III:  None.   AXIS IV:  Other  psychiatric problems related to chronic alcohol use, grief  issues.   AXIS V:  Current 30, highest in the past year 55-60.   PLAN:  Admission for alcohol dependence.  Stabilize mood and thinking.  Detox the patient from alcohol, force fluids, work on relapse prevention.  Will check a CAT scan for rule out a bleed or any other abnormality.  Case  manager is to look at any potential rehab programs available to the patient.  We will also discuss with the patient the possibilities of ReVia or Campral  to help with patient's relapsing.   ESTIMATED LENGTH OF STAY:  4-6 days.      JO/MEDQ  D:  05/10/2004  T:  05/10/2004  Job:  [id number]

## 2010-12-13 LAB — I-STAT 8, (EC8 V) (CONVERTED LAB)
BUN: 9
HCT: 44
Operator id: 277751
Sodium: 143
pH, Ven: 7.374 — ABNORMAL HIGH

## 2010-12-13 LAB — POCT I-STAT CREATININE
Creatinine, Ser: 0.8
Operator id: 277751

## 2010-12-13 LAB — ETHANOL
Alcohol, Ethyl (B): 165 — ABNORMAL HIGH
Alcohol, Ethyl (B): 214 — ABNORMAL HIGH
Alcohol, Ethyl (B): 294 — ABNORMAL HIGH

## 2010-12-13 LAB — RAPID URINE DRUG SCREEN, HOSP PERFORMED: Tetrahydrocannabinol: NOT DETECTED

## 2015-06-14 DIAGNOSIS — Z79899 Other long term (current) drug therapy: Secondary | ICD-10-CM | POA: Diagnosis not present

## 2015-06-14 DIAGNOSIS — M25539 Pain in unspecified wrist: Secondary | ICD-10-CM | POA: Diagnosis not present

## 2015-06-14 DIAGNOSIS — B009 Herpesviral infection, unspecified: Secondary | ICD-10-CM | POA: Diagnosis not present

## 2015-06-14 DIAGNOSIS — M19032 Primary osteoarthritis, left wrist: Secondary | ICD-10-CM | POA: Diagnosis not present

## 2015-06-14 DIAGNOSIS — M19031 Primary osteoarthritis, right wrist: Secondary | ICD-10-CM | POA: Diagnosis not present

## 2015-06-14 DIAGNOSIS — M0609 Rheumatoid arthritis without rheumatoid factor, multiple sites: Secondary | ICD-10-CM | POA: Diagnosis not present

## 2015-06-29 DIAGNOSIS — G510 Bell's palsy: Secondary | ICD-10-CM | POA: Diagnosis not present

## 2015-09-07 DIAGNOSIS — F411 Generalized anxiety disorder: Secondary | ICD-10-CM | POA: Diagnosis not present

## 2015-09-07 DIAGNOSIS — F4311 Post-traumatic stress disorder, acute: Secondary | ICD-10-CM | POA: Diagnosis not present

## 2015-10-12 DIAGNOSIS — M0609 Rheumatoid arthritis without rheumatoid factor, multiple sites: Secondary | ICD-10-CM | POA: Diagnosis not present

## 2015-10-12 DIAGNOSIS — B009 Herpesviral infection, unspecified: Secondary | ICD-10-CM | POA: Diagnosis not present

## 2015-10-12 DIAGNOSIS — Z79899 Other long term (current) drug therapy: Secondary | ICD-10-CM | POA: Diagnosis not present

## 2015-10-12 DIAGNOSIS — M25539 Pain in unspecified wrist: Secondary | ICD-10-CM | POA: Diagnosis not present

## 2015-10-19 DIAGNOSIS — F341 Dysthymic disorder: Secondary | ICD-10-CM | POA: Diagnosis not present

## 2015-10-19 DIAGNOSIS — F4311 Post-traumatic stress disorder, acute: Secondary | ICD-10-CM | POA: Diagnosis not present

## 2016-01-04 DIAGNOSIS — B009 Herpesviral infection, unspecified: Secondary | ICD-10-CM | POA: Diagnosis not present

## 2016-01-04 DIAGNOSIS — M0609 Rheumatoid arthritis without rheumatoid factor, multiple sites: Secondary | ICD-10-CM | POA: Diagnosis not present

## 2016-01-04 DIAGNOSIS — Z23 Encounter for immunization: Secondary | ICD-10-CM | POA: Diagnosis not present

## 2016-01-04 DIAGNOSIS — Z79899 Other long term (current) drug therapy: Secondary | ICD-10-CM | POA: Diagnosis not present

## 2016-01-04 DIAGNOSIS — M25539 Pain in unspecified wrist: Secondary | ICD-10-CM | POA: Diagnosis not present

## 2016-01-29 DIAGNOSIS — B009 Herpesviral infection, unspecified: Secondary | ICD-10-CM | POA: Diagnosis not present

## 2016-01-29 DIAGNOSIS — M25539 Pain in unspecified wrist: Secondary | ICD-10-CM | POA: Diagnosis not present

## 2016-01-29 DIAGNOSIS — Z79899 Other long term (current) drug therapy: Secondary | ICD-10-CM | POA: Diagnosis not present

## 2016-01-29 DIAGNOSIS — M0609 Rheumatoid arthritis without rheumatoid factor, multiple sites: Secondary | ICD-10-CM | POA: Diagnosis not present

## 2016-02-06 DIAGNOSIS — F339 Major depressive disorder, recurrent, unspecified: Secondary | ICD-10-CM | POA: Diagnosis not present

## 2016-02-06 DIAGNOSIS — E7800 Pure hypercholesterolemia, unspecified: Secondary | ICD-10-CM | POA: Diagnosis not present

## 2016-02-06 DIAGNOSIS — D7589 Other specified diseases of blood and blood-forming organs: Secondary | ICD-10-CM | POA: Diagnosis not present

## 2016-02-06 DIAGNOSIS — R634 Abnormal weight loss: Secondary | ICD-10-CM | POA: Diagnosis not present

## 2016-02-06 DIAGNOSIS — E162 Hypoglycemia, unspecified: Secondary | ICD-10-CM | POA: Diagnosis not present

## 2016-04-11 DIAGNOSIS — F410 Panic disorder [episodic paroxysmal anxiety] without agoraphobia: Secondary | ICD-10-CM | POA: Diagnosis not present

## 2016-04-11 DIAGNOSIS — F3342 Major depressive disorder, recurrent, in full remission: Secondary | ICD-10-CM | POA: Diagnosis not present

## 2016-04-18 DIAGNOSIS — M0609 Rheumatoid arthritis without rheumatoid factor, multiple sites: Secondary | ICD-10-CM | POA: Diagnosis not present

## 2016-04-18 DIAGNOSIS — Z79899 Other long term (current) drug therapy: Secondary | ICD-10-CM | POA: Diagnosis not present

## 2016-04-18 DIAGNOSIS — M25539 Pain in unspecified wrist: Secondary | ICD-10-CM | POA: Diagnosis not present

## 2016-04-18 DIAGNOSIS — B009 Herpesviral infection, unspecified: Secondary | ICD-10-CM | POA: Diagnosis not present

## 2016-05-26 DIAGNOSIS — K047 Periapical abscess without sinus: Secondary | ICD-10-CM | POA: Diagnosis not present

## 2016-07-16 DIAGNOSIS — M0609 Rheumatoid arthritis without rheumatoid factor, multiple sites: Secondary | ICD-10-CM | POA: Diagnosis not present

## 2016-07-16 DIAGNOSIS — Z79899 Other long term (current) drug therapy: Secondary | ICD-10-CM | POA: Diagnosis not present

## 2016-07-16 DIAGNOSIS — M130 Polyarthritis, unspecified: Secondary | ICD-10-CM | POA: Diagnosis not present

## 2016-10-03 DIAGNOSIS — F331 Major depressive disorder, recurrent, moderate: Secondary | ICD-10-CM | POA: Diagnosis not present

## 2016-10-17 DIAGNOSIS — M25532 Pain in left wrist: Secondary | ICD-10-CM | POA: Diagnosis not present

## 2016-10-17 DIAGNOSIS — S52092A Other fracture of upper end of left ulna, initial encounter for closed fracture: Secondary | ICD-10-CM | POA: Diagnosis not present

## 2016-10-17 DIAGNOSIS — S52552A Other extraarticular fracture of lower end of left radius, initial encounter for closed fracture: Secondary | ICD-10-CM | POA: Diagnosis not present

## 2016-10-17 DIAGNOSIS — S52602A Unspecified fracture of lower end of left ulna, initial encounter for closed fracture: Secondary | ICD-10-CM | POA: Diagnosis not present

## 2016-10-31 DIAGNOSIS — S52552A Other extraarticular fracture of lower end of left radius, initial encounter for closed fracture: Secondary | ICD-10-CM | POA: Diagnosis not present

## 2016-11-13 DIAGNOSIS — F410 Panic disorder [episodic paroxysmal anxiety] without agoraphobia: Secondary | ICD-10-CM | POA: Diagnosis not present

## 2016-11-13 DIAGNOSIS — F331 Major depressive disorder, recurrent, moderate: Secondary | ICD-10-CM | POA: Diagnosis not present

## 2016-11-21 DIAGNOSIS — S52552A Other extraarticular fracture of lower end of left radius, initial encounter for closed fracture: Secondary | ICD-10-CM | POA: Diagnosis not present

## 2016-11-21 DIAGNOSIS — S52602A Unspecified fracture of lower end of left ulna, initial encounter for closed fracture: Secondary | ICD-10-CM | POA: Diagnosis not present

## 2016-11-21 DIAGNOSIS — T300 Burn of unspecified body region, unspecified degree: Secondary | ICD-10-CM | POA: Diagnosis not present

## 2016-12-24 DIAGNOSIS — S52552A Other extraarticular fracture of lower end of left radius, initial encounter for closed fracture: Secondary | ICD-10-CM | POA: Diagnosis not present

## 2017-01-28 ENCOUNTER — Other Ambulatory Visit: Payer: Self-pay | Admitting: Orthopedic Surgery

## 2017-01-28 DIAGNOSIS — M1812 Unilateral primary osteoarthritis of first carpometacarpal joint, left hand: Secondary | ICD-10-CM | POA: Diagnosis not present

## 2017-01-29 ENCOUNTER — Encounter (HOSPITAL_BASED_OUTPATIENT_CLINIC_OR_DEPARTMENT_OTHER): Payer: Self-pay | Admitting: *Deleted

## 2017-01-29 ENCOUNTER — Other Ambulatory Visit: Payer: Self-pay
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
## 2017-01-30 ENCOUNTER — Encounter (HOSPITAL_COMMUNITY): Payer: Self-pay | Admitting: *Deleted

## 2017-01-30 ENCOUNTER — Other Ambulatory Visit: Payer: Self-pay

## 2017-01-30 NOTE — Progress Notes (Signed)
Spoke with pt for pre-op call. Pt denies cardiac history, chest pain or being diabetic.

## 2017-01-31 ENCOUNTER — Ambulatory Visit (HOSPITAL_COMMUNITY)
Admission: RE | Admit: 2017-01-31 | Discharge: 2017-01-31 | Disposition: A | Discharge status: Home / Self Care | Payer: BLUE CROSS/BLUE SHIELD | Source: Ambulatory Visit | Attending: Orthopedic Surgery | Admitting: Orthopedic Surgery

## 2017-01-31 ENCOUNTER — Ambulatory Visit (HOSPITAL_COMMUNITY): Payer: BLUE CROSS/BLUE SHIELD

## 2017-01-31 ENCOUNTER — Ambulatory Visit (HOSPITAL_COMMUNITY): Payer: BLUE CROSS/BLUE SHIELD | Admitting: Anesthesiology

## 2017-01-31 ENCOUNTER — Encounter (HOSPITAL_COMMUNITY): Payer: Self-pay | Admitting: *Deleted

## 2017-01-31 ENCOUNTER — Encounter (HOSPITAL_COMMUNITY)
Admission: RE | Disposition: A | Discharge status: Home / Self Care | Payer: Self-pay | Source: Ambulatory Visit | Attending: Orthopedic Surgery

## 2017-01-31 DIAGNOSIS — F329 Major depressive disorder, single episode, unspecified: Secondary | ICD-10-CM | POA: Diagnosis not present

## 2017-01-31 DIAGNOSIS — M1812 Unilateral primary osteoarthritis of first carpometacarpal joint, left hand: Secondary | ICD-10-CM | POA: Diagnosis not present

## 2017-01-31 DIAGNOSIS — F17210 Nicotine dependence, cigarettes, uncomplicated: Secondary | ICD-10-CM | POA: Diagnosis not present

## 2017-01-31 DIAGNOSIS — F418 Other specified anxiety disorders: Secondary | ICD-10-CM | POA: Diagnosis not present

## 2017-01-31 DIAGNOSIS — M25342 Other instability, left hand: Secondary | ICD-10-CM | POA: Diagnosis not present

## 2017-01-31 DIAGNOSIS — F419 Anxiety disorder, unspecified: Secondary | ICD-10-CM | POA: Insufficient documentation

## 2017-01-31 DIAGNOSIS — G8918 Other acute postprocedural pain: Secondary | ICD-10-CM | POA: Diagnosis not present

## 2017-01-31 DIAGNOSIS — Z419 Encounter for procedure for purposes other than remedying health state, unspecified: Secondary | ICD-10-CM

## 2017-01-31 DIAGNOSIS — M069 Rheumatoid arthritis, unspecified: Secondary | ICD-10-CM | POA: Diagnosis not present

## 2017-01-31 DIAGNOSIS — Z471 Aftercare following joint replacement surgery: Secondary | ICD-10-CM | POA: Diagnosis not present

## 2017-01-31 DIAGNOSIS — Z96692 Finger-joint replacement of left hand: Secondary | ICD-10-CM | POA: Diagnosis not present

## 2017-01-31 HISTORY — DX: Adverse effect of unspecified anesthetic, initial encounter: T41.45XA

## 2017-01-31 HISTORY — DX: Depression, unspecified: F32.A

## 2017-01-31 HISTORY — DX: Other complications of anesthesia, initial encounter: T88.59XA

## 2017-01-31 HISTORY — DX: Unspecified osteoarthritis, unspecified site: M19.90

## 2017-01-31 HISTORY — PX: CARPOMETACARPEL SUSPENSION PLASTY: SHX5005

## 2017-01-31 HISTORY — DX: Anxiety disorder, unspecified: F41.9

## 2017-01-31 HISTORY — DX: Major depressive disorder, single episode, unspecified: F32.9

## 2017-01-31 LAB — CBC
HCT: 39.3 % (ref 36.0–46.0)
Hemoglobin: 12.8 g/dL (ref 12.0–15.0)
MCH: 31.8 pg (ref 26.0–34.0)
MCHC: 32.6 g/dL (ref 30.0–36.0)
MCV: 97.8 fL (ref 78.0–100.0)
Platelets: 272 10*3/uL (ref 150–400)
RBC: 4.02 MIL/uL (ref 3.87–5.11)
RDW: 13.1 % (ref 11.5–15.5)
WBC: 8.4 10*3/uL (ref 4.0–10.5)

## 2017-01-31 SURGERY — CARPOMETACARPEL (CMC) SUSPENSION PLASTY
Anesthesia: Regional | Site: Thumb | Laterality: Left

## 2017-01-31 MED ORDER — CEFAZOLIN SODIUM-DEXTROSE 2-4 GM/100ML-% IV SOLN
2.0000 g | INTRAVENOUS | Status: AC
  Administered 2017-01-31: 2 g via INTRAVENOUS
  Filled 2017-01-31: qty 100

## 2017-01-31 MED ORDER — LACTATED RINGERS IV SOLN
INTRAVENOUS | Status: DC
  Administered 2017-01-31 (×2): via INTRAVENOUS

## 2017-01-31 MED ORDER — FENTANYL CITRATE (PF) 100 MCG/2ML IJ SOLN
INTRAMUSCULAR | Status: AC
  Filled 2017-01-31: qty 2

## 2017-01-31 MED ORDER — LIDOCAINE 2% (20 MG/ML) 5 ML SYRINGE
INTRAMUSCULAR | Status: AC
  Filled 2017-01-31: qty 5

## 2017-01-31 MED ORDER — ONDANSETRON HCL 4 MG/2ML IJ SOLN
INTRAMUSCULAR | Status: DC | PRN
Start: 2017-01-31 — End: 2017-01-31
  Administered 2017-01-31: 4 mg via INTRAVENOUS

## 2017-01-31 MED ORDER — LIDOCAINE 2% (20 MG/ML) 5 ML SYRINGE
INTRAMUSCULAR | Status: DC | PRN
  Administered 2017-01-31: 60 mg via INTRAVENOUS

## 2017-01-31 MED ORDER — FENTANYL CITRATE (PF) 250 MCG/5ML IJ SOLN
INTRAMUSCULAR | Status: AC
  Filled 2017-01-31: qty 5

## 2017-01-31 MED ORDER — OXYCODONE HCL 5 MG PO TABS: 5.0000 mg | ORAL_TABLET | Freq: Four times a day (QID) | ORAL | 0 refills | Status: DC | PRN

## 2017-01-31 MED ORDER — ONDANSETRON HCL 4 MG/2ML IJ SOLN: 4.0000 mg | Freq: Once | INTRAMUSCULAR | Status: DC | PRN

## 2017-01-31 MED ORDER — MIDAZOLAM HCL 2 MG/2ML IJ SOLN
INTRAMUSCULAR | Status: AC
  Filled 2017-01-31: qty 2

## 2017-01-31 MED ORDER — ONDANSETRON HCL 4 MG/2ML IJ SOLN
INTRAMUSCULAR | Status: AC
  Filled 2017-01-31: qty 2

## 2017-01-31 MED ORDER — DEXAMETHASONE SODIUM PHOSPHATE 10 MG/ML IJ SOLN
INTRAMUSCULAR | Status: DC | PRN
  Administered 2017-01-31: 10 mg via INTRAVENOUS

## 2017-01-31 MED ORDER — MIDAZOLAM HCL 2 MG/2ML IJ SOLN
INTRAMUSCULAR | Status: AC
  Administered 2017-01-31: 2 mg via INTRAVENOUS
  Filled 2017-01-31: qty 2

## 2017-01-31 MED ORDER — PROPOFOL 10 MG/ML IV BOLUS
INTRAVENOUS | Status: DC | PRN
  Administered 2017-01-31: 200 mg via INTRAVENOUS

## 2017-01-31 MED ORDER — PROPOFOL 10 MG/ML IV BOLUS
INTRAVENOUS | Status: AC
  Filled 2017-01-31: qty 20

## 2017-01-31 MED ORDER — EPHEDRINE 5 MG/ML INJ
INTRAVENOUS | Status: AC
  Filled 2017-01-31: qty 10

## 2017-01-31 MED ORDER — ROCURONIUM BROMIDE 10 MG/ML (PF) SYRINGE
PREFILLED_SYRINGE | INTRAVENOUS | Status: AC
  Filled 2017-01-31: qty 5

## 2017-01-31 MED ORDER — ROPIVACAINE HCL 5 MG/ML IJ SOLN
INTRAMUSCULAR | Status: DC | PRN
  Administered 2017-01-31: 25 mL via PERINEURAL

## 2017-01-31 MED ORDER — SUGAMMADEX SODIUM 200 MG/2ML IV SOLN
INTRAVENOUS | Status: AC
  Filled 2017-01-31: qty 2

## 2017-01-31 MED ORDER — MIDAZOLAM HCL 2 MG/2ML IJ SOLN
2.0000 mg | Freq: Once | INTRAMUSCULAR | Status: AC
  Administered 2017-01-31: 2 mg via INTRAVENOUS

## 2017-01-31 MED ORDER — 0.9 % SODIUM CHLORIDE (POUR BTL) OPTIME
TOPICAL | Status: DC | PRN
  Administered 2017-01-31: 1000 mL

## 2017-01-31 MED ORDER — DEXAMETHASONE SODIUM PHOSPHATE 10 MG/ML IJ SOLN
INTRAMUSCULAR | Status: AC
  Filled 2017-01-31: qty 1

## 2017-01-31 MED ORDER — FENTANYL CITRATE (PF) 100 MCG/2ML IJ SOLN: 25.0000 ug | INTRAMUSCULAR | Status: DC | PRN

## 2017-01-31 MED ORDER — EPHEDRINE SULFATE-NACL 50-0.9 MG/10ML-% IV SOSY
PREFILLED_SYRINGE | INTRAVENOUS | Status: DC | PRN
  Administered 2017-01-31 (×2): 5 mg via INTRAVENOUS

## 2017-01-31 SURGICAL SUPPLY — 62 items
BLADE LONG MED 31X9 (MISCELLANEOUS) ×2 IMPLANT
BLADE MINI RND TIP GREEN BEAV (BLADE) IMPLANT
BNDG CMPR 9X4 STRL LF SNTH (GAUZE/BANDAGES/DRESSINGS) ×1
BNDG COHESIVE 4X5 TAN STRL (GAUZE/BANDAGES/DRESSINGS) ×2 IMPLANT
BNDG ESMARK 4X9 LF (GAUZE/BANDAGES/DRESSINGS) ×2 IMPLANT
BNDG GAUZE ELAST 4 BULKY (GAUZE/BANDAGES/DRESSINGS) IMPLANT
BNDG PLASTER X FAST 4X5 WHT LF (CAST SUPPLIES) ×1 IMPLANT
BNDG PLSTR 5X4 XFST ST WHT LF (CAST SUPPLIES) ×1
CANISTER SUCTION 1200CC (MISCELLANEOUS) IMPLANT
CANISTER SUCTION 2500CC (MISCELLANEOUS) ×1 IMPLANT
CHLORAPREP W/TINT 26ML (MISCELLANEOUS) ×2 IMPLANT
CORD BIPOLAR FORCEPS 12FT (ELECTRODE) ×2 IMPLANT
COVER MAYO STAND STRL (DRAPES) IMPLANT
COVER SURGICAL LIGHT HANDLE (MISCELLANEOUS) ×2 IMPLANT
CUFF TOURNIQUET SINGLE 18IN (TOURNIQUET CUFF) ×2 IMPLANT
DRAPE C-ARM 42X72 X-RAY (DRAPES) ×2 IMPLANT
DRAPE SURG 17X23 STRL (DRAPES) ×2 IMPLANT
DRSG EMULSION OIL 3X3 NADH (GAUZE/BANDAGES/DRESSINGS) ×2 IMPLANT
ELECT CAUTERY BLADE 6.4 (BLADE) ×1 IMPLANT
GAUZE SPONGE 4X4 12PLY STRL LF (GAUZE/BANDAGES/DRESSINGS) ×2 IMPLANT
GLOVE BIO SURGEON STRL SZ7.5 (GLOVE) ×2 IMPLANT
GLOVE BIOGEL PI IND STRL 7.0 (GLOVE) ×1 IMPLANT
GLOVE BIOGEL PI IND STRL 8 (GLOVE) ×1 IMPLANT
GLOVE BIOGEL PI INDICATOR 7.0 (GLOVE) ×2
GLOVE BIOGEL PI INDICATOR 8 (GLOVE) ×1
GLOVE ECLIPSE 6.5 STRL STRAW (GLOVE) ×2 IMPLANT
GLOVE SURG SS PI 7.0 STRL IVOR (GLOVE) ×1 IMPLANT
GOWN STRL REUS W/ TWL LRG LVL3 (GOWN DISPOSABLE) ×2 IMPLANT
GOWN STRL REUS W/TWL LRG LVL3 (GOWN DISPOSABLE) ×6
GOWN STRL REUS W/TWL XL LVL3 (GOWN DISPOSABLE) ×2 IMPLANT
GUIDEWIRE 1.6 (WIRE) ×2
GUIDEWIRE ORTH 1.6XTROC (WIRE) ×1 IMPLANT
GUIDEWIRE PIN ORTH 6X1.6XSMTH (WIRE) IMPLANT
IMPL FINGER JT 5X30MM MED (Joint) IMPLANT
IMPLANT FINGER JT 5X30MM MED (Joint) ×2 IMPLANT
K-WIRE 1.6 (WIRE) ×2
KIT BASIN OR (CUSTOM PROCEDURE TRAY) ×2 IMPLANT
NDL HYPO 25X1 1.5 SAFETY (NEEDLE) IMPLANT
NEEDLE HYPO 25X1 1.5 SAFETY (NEEDLE) IMPLANT
NS IRRIG 1000ML POUR BTL (IV SOLUTION) ×2 IMPLANT
PACK ORTHO EXTREMITY (CUSTOM PROCEDURE TRAY) ×2 IMPLANT
PAD CAST 4YDX4 CTTN HI CHSV (CAST SUPPLIES) IMPLANT
PADDING CAST ABS 3INX4YD NS (CAST SUPPLIES) ×1
PADDING CAST ABS COTTON 3X4 (CAST SUPPLIES) IMPLANT
PADDING CAST COTTON 4X4 STRL (CAST SUPPLIES) ×2
RASP DURCEL BLADE 16MM (DRILL) ×1 IMPLANT
RASP LAG SCREW 16MM (DRILL) ×1 IMPLANT
REAMER 8MM (BIT) ×2 IMPLANT
SCREW LAG 4.0X26MM (Screw) ×2 IMPLANT
SLING ARM MED ADULT FOAM STRAP (SOFTGOODS) ×1 IMPLANT
STOCKINETTE 6  STRL (DRAPES) ×1
STOCKINETTE 6 STRL (DRAPES) ×1 IMPLANT
SUT ETHIBOND 3-0 V-5 (SUTURE) ×1 IMPLANT
SUT STEEL 4 (SUTURE) ×1 IMPLANT
SUT VIC AB 4-0 PS2 18 (SUTURE) ×1 IMPLANT
SUT VICRYL RAPIDE 4-0 (SUTURE) IMPLANT
SUT VICRYL RAPIDE 4/0 PS 2 (SUTURE) ×1 IMPLANT
SYR 10ML LL (SYRINGE) IMPLANT
TOWEL OR 17X24 6PK STRL BLUE (TOWEL DISPOSABLE) ×2 IMPLANT
TOWEL OR NON WOVEN STRL DISP B (DISPOSABLE) IMPLANT
TUBE CONNECTING 20X1/4 (TUBING) ×2 IMPLANT
UNDERPAD 30X30 (UNDERPADS AND DIAPERS) ×2 IMPLANT

## 2017-01-31 NOTE — Op Note (Signed)
01/31/2017  2:25 PM  PATIENT:  [name]  56 y.o. female  PRE-OPERATIVE DIAGNOSIS:  Left thumb arthritis  POST-OPERATIVE DIAGNOSIS:  Same  PROCEDURE:   1. Left thumb suspension arthroplasty (trapeziectomy with LRTI), 16109 & 60454    2. Left hemi-trapezoidectomy, 25447    3. Left thumb MP arthrodesis with autograft, 09811  SURGEON: Rayvon Char. 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  OPERATIVE IMPLANTS: Extremity Medical [facility] device  OPERATIVE PROCEDURE:  After receiving prophylactic antibiotics and a regional block, the patient was escorted to the operative theatre and placed in a supine position.  General anesthesia was administered.  A surgical "time-out" was performed during which the planned procedure, proposed operative site, and the correct patient identity were compared to the operative consent and agreement confirmed by the circulating nurse according to current facility policy.  Following application of a tourniquet to the operative extremity, the exposed skin was prepped with Chloraprep and draped in the usual sterile fashion.  The limb was exsanguinated with an Esmarch bandage and the tourniquet inflated to approximately 150mmHg higher than systolic BP.  Attention was first directed to the suspension arthroplasty.  A wagoner incision was made sharply with a scalpel, subcutaneous tissues dissected with blunt and spreading  dissection.  The rather large branch of the superficial radial nerve was lifted and protected in the dorsal flap.  The capsule was incised longitudinally, along the volar aspect of the border of the APL.  Capsule was reflected off of the trapezium around dorsal and volar, exposing the trapezium.  The [facility] joint was inspected, found to be largely devoid of cartilage, and trapeziectomy proceeded.  This was done piecemeal with a rongeur, scissors, and a scalpel.  FCR was protected.  The scaphotrapezoid joint was inspected, found to be arthritic as well with bone-on-bone, and the proximal aspect of the trapezoid was resected with an oscillating saw, rondure, and osteotome.  The base of the metacarpal was resected, just enough to remove the chondral surface.  A tunnel was made to pass the suspension tendon, ultimately enlarged to 3.5 mm.  I half strip of FCR tendon was harvested in standard fashion using accessory proximal incision.  The ulnar half was taken so that it would be the radial half distally.  It was dissected free into the base of the wound to the insertion on the second metacarpal and then left in the resection space.  Attention was then shifted to the metacarpal phalangeal joint, where a dorsal exposure was made.  The extensor apparatus was split and reflected, as was the capsule.  The MP joint was inspected, found to be arthritic as well, in addition to unstable in 2 directions.  Arthrodesis commenced.  A guidewire from the Extremity Medical X MCP set was placed down the metacarpal, confirmed fluoroscopically, and the entry reamer used.  A 6 mm proximal implant was selected and placed.  The distal aspect of the metacarpal was prepared with a rongeur and the sets reamer,  and attention shifted to the distal implant.  The base of the proximal phalanx was exposed, guidewire placed with fluoroscopic guidance, and the surface planed flat with the reamer.  Once it surface was appropriately prepared, the distal  implant was measured, 26 was selected, and it was placed but not fully tightened.  Attention was shifted back to the suspension, where the half strip of FCR was brought through the tunnel, back down to the dorsal capsule and ultimately tied and a half hitch upon its intact portion.  The resection space was irrigated copiously.  The suspension was not yet tightened.  The MP arthrodesis site was then watched as it was approximated with good firm compression while tightening the implant.  There was a slight dorsal crevice into which cancellous bone autograft was placed, which was saved from the reamings.  Attention was then shifted back to the suspension, where the FCR was tensioned and tied as described above.  It was secured with 3-0 Ethibond suture, both of the dorsal periosteum and APL as well as the suture used to secure the knotted tendon.  Retention sutures were placed in the depth of the resection space, and this was used to secure the interposition material from the rest of the FCR as well as the knot into the resection space.  All the wounds were again copiously irrigated.  3-0 Ethibond suture was used to close the capsule at the [facility] joint.  The tourniquet was released, some additional hemostasis obtained, and the capsule and extensor apparatus at the MP fusion site was reapproximated with 4-0 Vicryl plus.  All of the skin was then closed with 4-0 Vicryl Rapide running horizontal mattress sutures and a forearm-based thumb spica splint was applied.  The thumb was in good position and alignment, palmarly abducted.  She was awakened and taken to the recovery room stable condition, breathing spontaneously  DISPOSITION: She will be discharged home today with typical instructions, returning to the office in 10-15 days, no x-rays required, with follow-on hand therapy appointment for custom splint fabrication and initiation of rehab.

## 2017-01-31 NOTE — Anesthesia Procedure Notes (Signed)
Anesthesia Regional Block: Supraclavicular block   Pre-Anesthetic Checklist: ,, timeout performed, Correct Patient, Correct Site, Correct Laterality, Correct Procedure,, site marked, risks and benefits discussed, Surgical consent,  Pre-op evaluation,  At surgeon's request and post-op pain management  Laterality: Left  Prep: chloraprep       Needles:  Injection technique: Single-shot  Needle Type: Echogenic Stimulator Needle     Needle Length: 9cm  Needle Gauge: 21     Additional Needles:   Procedures:,,,, ultrasound used (permanent image in chart),,,,  Narrative:  Start time: 01/31/2017 2:05 PM End time: 01/31/2017 2:15 PM Injection made incrementally with aspirations every 5 mL.  Performed by: Personally  Anesthesiologist: Murvin Natal, MD  Additional Notes: Functioning IV was confirmed and monitors were applied.  A 45mm 21ga Arrow echogenic stimulator needle was used. Sterile prep, hand hygiene and sterile gloves were used.  Negative aspiration and negative test dose prior to incremental administration of local anesthetic. The patient tolerated the procedure well.

## 2017-01-31 NOTE — Anesthesia Procedure Notes (Signed)
Procedure Name: LMA Insertion Date/Time: 01/31/2017 3:12 PM Performed by: Barrington Ellison, CRNA Pre-anesthesia Checklist: Patient identified, Emergency Drugs available, Suction available and Patient being monitored Patient Re-evaluated:Patient Re-evaluated prior to induction Oxygen Delivery Method: Circle System Utilized Preoxygenation: Pre-oxygenation with 100% oxygen Induction Type: IV induction Ventilation: Mask ventilation without difficulty LMA: LMA inserted LMA Size: 4.0 Number of attempts: 1 Placement Confirmation: positive ETCO2 Tube secured with: Tape Dental Injury: Teeth and Oropharynx as per pre-operative assessment

## 2017-01-31 NOTE — Transfer of Care (Signed)
Immediate Anesthesia Transfer of Care Note  Patient: [name]  Procedure(s) Performed: LEFT THUMB SUSPENSION ARTHROPLASTY AND METACARPOPHALANGEAL JOINT ARTHRODESIS (Left Thumb)  Patient Location: PACU  Anesthesia Type:General  Level of Consciousness: awake, alert  and oriented  Airway & Oxygen Therapy: Patient Spontanous Breathing and Patient connected to nasal cannula oxygen  Post-op Assessment: Report given to RN  Post vital signs: Reviewed and stable  Last Vitals:  Vitals:   01/31/17 1427 01/31/17 1428  BP:    Pulse: 68 70  Resp: 19 18  Temp:    SpO2: 100% 100%    Last Pain:  Vitals:   01/31/17 1310  TempSrc:   PainSc: 7       Patients Stated Pain Goal: 3 (28/24/17 5301)  Complications: No apparent anesthesia complications

## 2017-01-31 NOTE — Discharge Instructions (Addendum)
Discharge Instructions   You have a dressing with a plaster splint incorporated in it. Move your fingers as much as possible, making a full fist and fully opening the fist. Elevate your hand to reduce pain & swelling of the digits.  Ice over the operative site or in your arm pit may be helpful to reduce pain & swelling.  DO NOT USE HEAT. Pain medicine has been prescribed for you.  Take Tylenol, 650 mg, every 6 hours. Take the oxycodone as a rescue medicine for severe pain. Leave the dressing in place until you return to our office.  You may shower, but keep the bandage clean & dry.  You may drive a car when you are off of prescription pain medications and can safely control your vehicle with both hands. Our office will call you to arrange follow-up Do not resume methotrexate or Humira until at least your first follow-up appointment   Please call [phone] during normal business hours or [phone] after hours for any problems. Including the following:  - excessive redness of the incisions - drainage for more than 4 days - fever of more than 101.5 F  *Please note that pain medications will not be refilled after hours or on weekends.

## 2017-01-31 NOTE — Interval H&P Note (Signed)
History and Physical Interval Note:  01/31/2017 2:25 PM  [name]  has presented today for surgery, with the diagnosis of LEFT THUMB TRAPEZIOMETACARPAL JOINT OSTEOARTHRITIS, AN METACARPOPHALANGEAL INSTABILITY M18.12  The various methods of treatment have been discussed with the patient and family. After consideration of risks, benefits and other options for treatment, the patient has consented to  Procedure(s): LEFT THUMB SUSPENSION ARTHROPLASTY AND METACARPOPHALANGEAL JOINT ARTHRODESIS (Left) as a surgical intervention .  The patient's history has been reviewed, patient examined, no change in status, stable for surgery.  I have reviewed the patient's chart and labs.  Questions were answered to the patient's satisfaction.     Jolyn Nap

## 2017-01-31 NOTE — Anesthesia Preprocedure Evaluation (Addendum)
Anesthesia Evaluation  Patient identified by MRN, date of birth, ID band Patient awake    Reviewed: Allergy & Precautions, NPO status , Patient's Chart, lab work & pertinent test results  History of Anesthesia Complications (+) PROLONGED EMERGENCE and history of anesthetic complications  Airway Mallampati: II  TM Distance: >3 FB Neck ROM: Full    Dental no notable dental hx.    Pulmonary Current Smoker,    Pulmonary exam normal breath sounds clear to auscultation       Cardiovascular negative cardio ROS Normal cardiovascular exam Rhythm:Regular Rate:Normal     Neuro/Psych PSYCHIATRIC DISORDERS Anxiety Depression negative neurological ROS     GI/Hepatic negative GI ROS, Neg liver ROS,   Endo/Other  negative endocrine ROS  Renal/GU negative Renal ROS     Musculoskeletal  LEFT THUMB TRAPEZIOMETACARPAL JOINT OSTEOARTHRITIS, AN METACARPOPHALANGEAL INSTABILITY   Abdominal   Peds  Hematology negative hematology ROS (+)   Anesthesia Other Findings   Reproductive/Obstetrics                            Anesthesia Physical Anesthesia Plan  ASA: II  Anesthesia Plan: General and Regional   Post-op Pain Management: GA combined w/ Regional for post-op pain   Induction: Intravenous  PONV Risk Score and Plan: 2 and Dexamethasone, Ondansetron and Midazolam  Airway Management Planned: LMA  Additional Equipment:   Intra-op Plan:   Post-operative Plan: Extubation in OR  Informed Consent: I have reviewed the patients History and Physical, chart, labs and discussed the procedure including the risks, benefits and alternatives for the proposed anesthesia with the patient or authorized representative who has indicated his/her understanding and acceptance.   Dental advisory given  Plan Discussed with: CRNA  Anesthesia Plan Comments:        Anesthesia Quick Evaluation

## 2017-02-01 NOTE — Anesthesia Postprocedure Evaluation (Signed)
Anesthesia Post Note  Patient: [name]  Procedure(s) Performed: LEFT THUMB SUSPENSION ARTHROPLASTY AND METACARPOPHALANGEAL JOINT ARTHRODESIS (Left Thumb)     Patient location during evaluation: PACU Anesthesia Type: Regional and General Level of consciousness: awake and alert Pain management: pain level controlled Vital Signs Assessment: post-procedure vital signs reviewed and stable Respiratory status: spontaneous breathing, nonlabored ventilation, respiratory function stable and patient connected to nasal cannula oxygen Cardiovascular status: blood pressure returned to baseline and stable Postop Assessment: no apparent nausea or vomiting Anesthetic complications: no    Last Vitals:  Vitals:   01/31/17 1730 01/31/17 1745  BP: (!) 180/87 (!) 172/94  Pulse: 74 69  Resp: 14 (!) 24  Temp:  (!) 36.4 C  SpO2: 96% 95%    Last Pain:  Vitals:   01/31/17 1745  TempSrc:   PainSc: 0-No pain                 Lillee Mooneyhan S

## 2017-02-05 ENCOUNTER — Encounter (HOSPITAL_COMMUNITY): Payer: Self-pay | Admitting: Orthopedic Surgery

## 2017-02-12 DIAGNOSIS — M1812 Unilateral primary osteoarthritis of first carpometacarpal joint, left hand: Secondary | ICD-10-CM | POA: Diagnosis not present

## 2017-02-12 DIAGNOSIS — M79642 Pain in left hand: Secondary | ICD-10-CM | POA: Diagnosis not present

## 2017-02-27 DIAGNOSIS — M1812 Unilateral primary osteoarthritis of first carpometacarpal joint, left hand: Secondary | ICD-10-CM | POA: Diagnosis not present

## 2017-02-27 DIAGNOSIS — M79642 Pain in left hand: Secondary | ICD-10-CM | POA: Diagnosis not present

## 2017-03-11 DIAGNOSIS — M79642 Pain in left hand: Secondary | ICD-10-CM | POA: Diagnosis not present

## 2017-03-11 DIAGNOSIS — M1812 Unilateral primary osteoarthritis of first carpometacarpal joint, left hand: Secondary | ICD-10-CM | POA: Diagnosis not present

## 2017-03-19 DIAGNOSIS — M1812 Unilateral primary osteoarthritis of first carpometacarpal joint, left hand: Secondary | ICD-10-CM | POA: Diagnosis not present

## 2017-03-25 DIAGNOSIS — M79642 Pain in left hand: Secondary | ICD-10-CM | POA: Diagnosis not present

## 2017-03-25 DIAGNOSIS — M1812 Unilateral primary osteoarthritis of first carpometacarpal joint, left hand: Secondary | ICD-10-CM | POA: Diagnosis not present

## 2017-04-03 DIAGNOSIS — M1812 Unilateral primary osteoarthritis of first carpometacarpal joint, left hand: Secondary | ICD-10-CM | POA: Diagnosis not present

## 2017-04-03 DIAGNOSIS — M79642 Pain in left hand: Secondary | ICD-10-CM | POA: Diagnosis not present

## 2017-04-17 DIAGNOSIS — M79642 Pain in left hand: Secondary | ICD-10-CM | POA: Diagnosis not present

## 2017-04-17 DIAGNOSIS — M1812 Unilateral primary osteoarthritis of first carpometacarpal joint, left hand: Secondary | ICD-10-CM | POA: Diagnosis not present

## 2017-04-30 DIAGNOSIS — F331 Major depressive disorder, recurrent, moderate: Secondary | ICD-10-CM | POA: Diagnosis not present

## 2017-04-30 DIAGNOSIS — M182 Bilateral post-traumatic osteoarthritis of first carpometacarpal joints: Secondary | ICD-10-CM | POA: Diagnosis not present

## 2017-05-07 DIAGNOSIS — M79642 Pain in left hand: Secondary | ICD-10-CM | POA: Diagnosis not present

## 2017-05-07 DIAGNOSIS — M1812 Unilateral primary osteoarthritis of first carpometacarpal joint, left hand: Secondary | ICD-10-CM | POA: Diagnosis not present

## 2017-05-14 DIAGNOSIS — M810 Age-related osteoporosis without current pathological fracture: Secondary | ICD-10-CM | POA: Diagnosis not present

## 2017-05-14 DIAGNOSIS — M19042 Primary osteoarthritis, left hand: Secondary | ICD-10-CM | POA: Diagnosis not present

## 2017-05-14 DIAGNOSIS — E559 Vitamin D deficiency, unspecified: Secondary | ICD-10-CM | POA: Diagnosis not present

## 2017-05-14 DIAGNOSIS — M79641 Pain in right hand: Secondary | ICD-10-CM | POA: Diagnosis not present

## 2017-05-14 DIAGNOSIS — Z79899 Other long term (current) drug therapy: Secondary | ICD-10-CM | POA: Diagnosis not present

## 2017-05-14 DIAGNOSIS — M0609 Rheumatoid arthritis without rheumatoid factor, multiple sites: Secondary | ICD-10-CM | POA: Diagnosis not present

## 2017-05-14 DIAGNOSIS — M8580 Other specified disorders of bone density and structure, unspecified site: Secondary | ICD-10-CM | POA: Diagnosis not present

## 2017-05-14 DIAGNOSIS — M19041 Primary osteoarthritis, right hand: Secondary | ICD-10-CM | POA: Diagnosis not present

## 2017-05-14 DIAGNOSIS — M79642 Pain in left hand: Secondary | ICD-10-CM | POA: Diagnosis not present

## 2017-05-14 DIAGNOSIS — M130 Polyarthritis, unspecified: Secondary | ICD-10-CM | POA: Diagnosis not present

## 2017-06-26 ENCOUNTER — Other Ambulatory Visit (HOSPITAL_COMMUNITY)
Admission: RE | Admit: 2017-06-26 | Discharge: 2017-06-26 | Disposition: A | Discharge status: Home / Self Care | Payer: BLUE CROSS/BLUE SHIELD | Source: Ambulatory Visit | Attending: Family Medicine | Admitting: Family Medicine

## 2017-06-26 ENCOUNTER — Other Ambulatory Visit: Payer: Self-pay | Admitting: Family Medicine

## 2017-06-26 DIAGNOSIS — D7589 Other specified diseases of blood and blood-forming organs: Secondary | ICD-10-CM | POA: Diagnosis not present

## 2017-06-26 DIAGNOSIS — Z124 Encounter for screening for malignant neoplasm of cervix: Secondary | ICD-10-CM | POA: Diagnosis not present

## 2017-06-26 DIAGNOSIS — F339 Major depressive disorder, recurrent, unspecified: Secondary | ICD-10-CM | POA: Diagnosis not present

## 2017-06-26 DIAGNOSIS — Z1159 Encounter for screening for other viral diseases: Secondary | ICD-10-CM | POA: Diagnosis not present

## 2017-06-26 DIAGNOSIS — Z23 Encounter for immunization: Secondary | ICD-10-CM | POA: Diagnosis not present

## 2017-06-26 DIAGNOSIS — Z0000 Encounter for general adult medical examination without abnormal findings: Secondary | ICD-10-CM | POA: Diagnosis not present

## 2017-06-26 DIAGNOSIS — E7800 Pure hypercholesterolemia, unspecified: Secondary | ICD-10-CM | POA: Diagnosis not present

## 2017-06-26 DIAGNOSIS — Z800 Family history of malignant neoplasm of digestive organs: Secondary | ICD-10-CM | POA: Diagnosis not present

## 2017-06-30 ENCOUNTER — Other Ambulatory Visit: Payer: Self-pay | Admitting: Family Medicine

## 2017-06-30 DIAGNOSIS — N6320 Unspecified lump in the left breast, unspecified quadrant: Secondary | ICD-10-CM

## 2017-06-30 LAB — CYTOLOGY - PAP
Adequacy: ABSENT
Diagnosis: NEGATIVE
HPV: NOT DETECTED

## 2017-07-02 DIAGNOSIS — R7989 Other specified abnormal findings of blood chemistry: Secondary | ICD-10-CM | POA: Diagnosis not present

## 2017-07-02 DIAGNOSIS — M1812 Unilateral primary osteoarthritis of first carpometacarpal joint, left hand: Secondary | ICD-10-CM | POA: Diagnosis not present

## 2017-07-03 ENCOUNTER — Ambulatory Visit
Admission: RE | Admit: 2017-07-03 | Discharge: 2017-07-03 | Disposition: A | Discharge status: Home / Self Care | Payer: BLUE CROSS/BLUE SHIELD | Source: Ambulatory Visit | Attending: Family Medicine | Admitting: Family Medicine

## 2017-07-03 ENCOUNTER — Other Ambulatory Visit: Payer: Self-pay | Admitting: Family Medicine

## 2017-07-03 DIAGNOSIS — N6310 Unspecified lump in the right breast, unspecified quadrant: Secondary | ICD-10-CM

## 2017-07-03 DIAGNOSIS — N6489 Other specified disorders of breast: Secondary | ICD-10-CM | POA: Diagnosis not present

## 2017-07-03 DIAGNOSIS — N6320 Unspecified lump in the left breast, unspecified quadrant: Secondary | ICD-10-CM

## 2017-07-03 DIAGNOSIS — N6322 Unspecified lump in the left breast, upper inner quadrant: Secondary | ICD-10-CM | POA: Diagnosis not present

## 2017-07-03 DIAGNOSIS — R922 Inconclusive mammogram: Secondary | ICD-10-CM | POA: Diagnosis not present

## 2017-08-01 DIAGNOSIS — D126 Benign neoplasm of colon, unspecified: Secondary | ICD-10-CM | POA: Diagnosis not present

## 2017-08-01 DIAGNOSIS — Z1211 Encounter for screening for malignant neoplasm of colon: Secondary | ICD-10-CM | POA: Diagnosis not present

## 2017-08-01 DIAGNOSIS — C20 Malignant neoplasm of rectum: Secondary | ICD-10-CM | POA: Diagnosis not present

## 2017-08-05 DIAGNOSIS — M25531 Pain in right wrist: Secondary | ICD-10-CM | POA: Diagnosis not present

## 2017-08-05 DIAGNOSIS — M0609 Rheumatoid arthritis without rheumatoid factor, multiple sites: Secondary | ICD-10-CM | POA: Diagnosis not present

## 2017-08-05 DIAGNOSIS — C20 Malignant neoplasm of rectum: Secondary | ICD-10-CM | POA: Diagnosis not present

## 2017-08-05 DIAGNOSIS — Z79899 Other long term (current) drug therapy: Secondary | ICD-10-CM | POA: Diagnosis not present

## 2017-08-05 DIAGNOSIS — M8580 Other specified disorders of bone density and structure, unspecified site: Secondary | ICD-10-CM | POA: Diagnosis not present

## 2017-08-05 DIAGNOSIS — M130 Polyarthritis, unspecified: Secondary | ICD-10-CM | POA: Diagnosis not present

## 2017-08-05 DIAGNOSIS — D126 Benign neoplasm of colon, unspecified: Secondary | ICD-10-CM | POA: Diagnosis not present

## 2017-08-05 DIAGNOSIS — Z1211 Encounter for screening for malignant neoplasm of colon: Secondary | ICD-10-CM | POA: Diagnosis not present

## 2017-08-06 ENCOUNTER — Other Ambulatory Visit: Payer: Self-pay | Admitting: Gastroenterology

## 2017-08-06 DIAGNOSIS — C189 Malignant neoplasm of colon, unspecified: Secondary | ICD-10-CM

## 2017-08-07 ENCOUNTER — Ambulatory Visit
Admission: RE | Admit: 2017-08-07 | Discharge: 2017-08-07 | Disposition: A | Discharge status: Home / Self Care | Payer: BLUE CROSS/BLUE SHIELD | Source: Ambulatory Visit | Attending: Gastroenterology | Admitting: Gastroenterology

## 2017-08-07 DIAGNOSIS — C189 Malignant neoplasm of colon, unspecified: Secondary | ICD-10-CM

## 2017-08-07 MED ORDER — IOPAMIDOL (ISOVUE-300) INJECTION 61%
100.0000 mL | Freq: Once | INTRAVENOUS | Status: AC | PRN
  Administered 2017-08-07: 100 mL via INTRAVENOUS

## 2017-08-08 DIAGNOSIS — E7800 Pure hypercholesterolemia, unspecified: Secondary | ICD-10-CM | POA: Diagnosis not present

## 2017-08-08 DIAGNOSIS — C189 Malignant neoplasm of colon, unspecified: Secondary | ICD-10-CM | POA: Diagnosis not present

## 2017-08-12 ENCOUNTER — Telehealth: Payer: Self-pay | Admitting: Oncology

## 2017-08-12 ENCOUNTER — Encounter: Payer: Self-pay | Admitting: Oncology

## 2017-08-12 DIAGNOSIS — C20 Malignant neoplasm of rectum: Secondary | ICD-10-CM | POA: Diagnosis not present

## 2017-08-12 NOTE — Telephone Encounter (Signed)
New referral received from Dr. Catalina [facility], for new dx of colon cancer. Pt has been scheduled to see Dr. Benay Spice on 6/27 at 3pm. Letter mailed.

## 2017-08-12 NOTE — Progress Notes (Signed)
Called patient to introduce my role as GI Navigator and to explain the referral process. Pt understands that we will be working closely with Dr. Dema Severin to provide oncology care as needed. Patient is scheduled to see Dr. Benay Spice on 08/21/17 with the understanding that Dr. Dema Severin will let us know if that appointment needs to be delayed. I provided my contact information for further questions.

## 2017-08-13 ENCOUNTER — Ambulatory Visit: Payer: Self-pay | Admitting: Surgery

## 2017-08-13 NOTE — H&P (Signed)
CC: Referral from Dr. Alessandra Bevels for newly diagnosed rectal cancer  HPI: [name] is a very pleasant 57yoF with hx of diet controlled HLD presented to the office for evaluation of a newly diagnosed rectal cancer. She has never had a colonoscopy prior to earlier this month. Dr. Alessandra Bevels performed a colonoscopy 08/01/17 for rectal bleeding which she had had for 6-7 months.  Colonoscopy is significant for: -5 mm to adenoma removed from the sigmoid -30 mm polyp estimated to be 15 cm from the anus, multilobulated, semi-pedunculated, partially removed with hot snare in piecemeal fashion and tattooed. 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 PT 1 NX MX-IHC being performed separately.  She underwent staging CT chest abdomen and pelvis which demonstrated no evidence of metastatic disease  She reports that she does have some urinary urgency and occasionally incontinent episodes with coughing. She also reports occasional incontinence to gas. She has liquid stool accidents 1-2 times per month but does not wear a pad or diaper and states this is not interfering with her quality of life or alter her daily activities.  OB: [id number]; she did have one C-section and one vaginal delivery. Her vaginal delivery was complicated by a "bad tear" that she states required repair of her rectum as well.   PMH: HLD well controlled with diet  PSH: C-section 1 via Pfannenstiel; left thumb surgeries. Denies any prior abdominal surgeries aside from the C-section.  FHx: Father had colon cancer; brother had rectal cancer; another brother has had polyps  Social: Smokes 1 pack per day for 30 years; performed alcoholic-quit 11 years ago; performed drug user-quit many years ago. She works in Aeronautical engineer for [employer]: A comprehensive 10 system review of systems  was completed with the patient and pertinent findings as noted above.  The patient is a 57 year old female.   Past Surgical History (Tanisha A. Owens Shark, [place]; 08/12/2017 9:23 AM) No pertinent past surgical history   Diagnostic Studies History (Tanisha A. Owens Shark, [place]; 08/12/2017 9:23 AM) Colonoscopy  within last year Mammogram  within last year Pap Smear  1-5 years ago  Allergies (Tanisha A. Owens Shark, [place]; 08/12/2017 9:24 AM) No Known Drug Allergies [08/12/2017]: Allergies Reconciled   Medication History (Tanisha A. Owens Shark, RMA; 08/12/2017 9:27 AM) Calcium (250MG  Tablet, Oral) Active. Cymbalta (60MG  Capsule DR Part, Oral) Active. Folic Acid (1MG  Tablet, Oral) Active. Fosamax (70MG  Tablet, Oral) Active. Ibuprofen (600MG  Tablet, Oral) Active. Lysine (500MG  Tablet, Oral) Active. Trintellix (10MG  Tablet, Oral) Active. Vitamin D (Ergocalciferol) (50000UNIT Capsule, Oral) Active. Medications Reconciled  Social History (Tanisha A. Owens Shark, [place]; 08/12/2017 9:23 AM) Alcohol use  Heavy alcohol use. Caffeine use  Coffee, Tea. Illicit drug use  Remotely quit drug use. Tobacco use  Current every day smoker.  Family History (Tanisha A. Owens Shark, [place]; 08/12/2017 9:23 AM) Alcohol Abuse  Brother. Arthritis  Mother, Sister. Breast Cancer  Family Members In [place], Father. Depression  Brother, Sister. Diabetes Mellitus  Mother. Hypertension  Mother. Prostate Cancer  Brother. Seizure disorder  Sister.  Pregnancy / Birth History (Tanisha A. Owens Shark, [place]; 08/12/2017 9:23 AM) Age at menarche  26 years. Age of menopause  68-50 Contraceptive History  Oral contraceptives. Gravida  6 Maternal age  <15 Para  2 Regular periods   Other Problems (Tanisha A. Owens Shark, [place]; 08/12/2017 9:23 AM) Alcohol Abuse  Anxiety Disorder  Arthritis  Depression  Hypercholesterolemia  Migraine Headache     Review of Systems (Tanisha A. Brown RMA; 08/12/2017 9:23  AM) General Present- Appetite Loss, Fatigue and Weight Loss. Not Present- Chills, Fever, Night Sweats and Weight Gain. Skin Not Present- Change in Wart/Mole, Dryness, Hives, Jaundice, New Lesions, Non-Healing Wounds, Rash and Ulcer. HEENT Present- Ringing in the Ears and Wears glasses/contact lenses. Not Present- Earache, Hearing Loss, Hoarseness, Nose Bleed, Oral Ulcers, Seasonal Allergies, Sinus Pain, Sore Throat, Visual Disturbances and Yellow Eyes. Respiratory Present- Chronic Cough. Not Present- Bloody sputum, Difficulty Breathing, Snoring and Wheezing. Breast Not Present- Breast Mass, Breast Pain, Nipple Discharge and Skin Changes. Cardiovascular Not Present- Chest Pain, Difficulty Breathing Lying Down, Leg Cramps, Palpitations, Rapid Heart Rate, Shortness of Breath and Swelling of Extremities. Gastrointestinal Present- Bloody Stool and Nausea. Not Present- Abdominal Pain, Bloating, Change in Bowel Habits, Chronic diarrhea, Constipation, Difficulty Swallowing, Excessive gas, Gets full quickly at meals, Hemorrhoids, Indigestion, Rectal Pain and Vomiting. Female Genitourinary Not Present- Frequency, Nocturia, Painful Urination, Pelvic Pain and Urgency. Musculoskeletal Present- Joint Pain and Swelling of Extremities. Not Present- Back Pain, Joint Stiffness, Muscle Pain and Muscle Weakness. Neurological Not Present- Decreased Memory, Fainting, Headaches, Numbness, Seizures, Tingling, Tremor, Trouble walking and Weakness. Psychiatric Present- Anxiety, Change in Sleep Pattern and Depression. Not Present- Bipolar, Fearful and Frequent crying. Endocrine Present- Cold Intolerance and Hair Changes. Not Present- Excessive Hunger, Heat Intolerance, Hot flashes and New Diabetes. Hematology Present- Easy Bruising. Not Present- Blood Thinners, Excessive bleeding, Gland problems, HIV and Persistent Infections.  Vitals (Tanisha A. Brown RMA; 08/12/2017 9:24 AM) 08/12/2017 9:24 AM Weight: 113 lb Height:  62in Body Surface Area: 1.5 m Body Mass Index: 20.67 kg/m  Temp.: 97.30F  Pulse: 82 (Regular)  BP: 110/72 (Sitting, Left Arm, Standard)       Physical Exam Harrell Gave M. Yoshua Geisinger MD; 08/12/2017 10:01 AM) The physical exam findings are as follows: Note:Constitutional: No acute distress; conversant; no deformities Eyes: Moist conjunctiva; no lid lag; anicteric sclerae; pupils equal round and reactive to light Neck: Trachea midline; no palpable thyromegaly Lungs: Normal respiratory effort; no tactile fremitus CV: Regular rate and rhythm; no palpable thrill; no pitting edema GI: Abdomen soft, nontender, nondistended; no palpable hepatosplenomegaly Anorectal: Mildly decreased anal tone; internal hemorrhoids; questionably palpable mass versus stool posteriorly 6-7 cm from anal verge MSK: Normal gait; no clubbing/cyanosis Psychiatric: Appropriate affect; alert and oriented 3 Lymphatic: No palpable cervical or axillary lymphadenopathy **A chaperone, Lennart Pall, was present for the entire physical exam    Assessment & Plan Harrell Gave M. Jostin Rue MD; 08/12/2017 10:05 AM) RECTAL CANCER (C20) Impression: Ms. Catanese is a very pleasant 36yoF with hx of HLD - newly diagnosed rectal cancer, estimated to be 15 cm from the anal verge based on Dr. Leanna Sato note but there is discordance potentially other physical examination here. -Will obtain urgent MRI pelvis with contrast for local staging purposes and to evaluate for lymph node involvement given the tumor abutting was found on her pathology specimen which places her at increased risk for nodal metastases even in the setting of a T1 cancer -We'll schedule her for urgent flex sigmoidoscopy to definitively determine where this lesion is located in the rectum -CEA level -Will present at MDT following obtaining her MRI and flexible sigmoidoscopy -We'll bring her back to the office following all of this for further discussion and  correlation of care -The anatomy and physiology of the GI tract was discussed at length with the patient with associated pictures. We discussed the pathophysiology of polyps and cancer.  We discussed that based on the location of the tumor, next steps and care will be determined. We will also need her MRI for local staging purposes. All of her questions were answered to her satisfaction and she voiced understanding -65 minutes was spent face to face with the patient, >50% of which was spent in direct counseling

## 2017-08-13 NOTE — H&P (Deleted)
  The note originally documented on this encounter has been moved the the encounter in which it belongs.  

## 2017-08-14 ENCOUNTER — Telehealth: Payer: Self-pay | Admitting: Oncology

## 2017-08-14 ENCOUNTER — Other Ambulatory Visit: Payer: Self-pay

## 2017-08-14 ENCOUNTER — Ambulatory Visit (HOSPITAL_COMMUNITY): Payer: BLUE CROSS/BLUE SHIELD | Admitting: Anesthesiology

## 2017-08-14 ENCOUNTER — Encounter (HOSPITAL_COMMUNITY): Payer: Self-pay | Admitting: Registered Nurse

## 2017-08-14 ENCOUNTER — Encounter (HOSPITAL_COMMUNITY)
Admission: RE | Disposition: A | Discharge status: Home / Self Care | Payer: Self-pay | Source: Ambulatory Visit | Attending: Surgery

## 2017-08-14 ENCOUNTER — Ambulatory Visit (HOSPITAL_COMMUNITY)
Admission: RE | Admit: 2017-08-14 | Discharge: 2017-08-14 | Disposition: A | Discharge status: Home / Self Care | Payer: BLUE CROSS/BLUE SHIELD | Source: Ambulatory Visit | Attending: Surgery | Admitting: Surgery

## 2017-08-14 DIAGNOSIS — Z800 Family history of malignant neoplasm of digestive organs: Secondary | ICD-10-CM | POA: Diagnosis not present

## 2017-08-14 DIAGNOSIS — Z79899 Other long term (current) drug therapy: Secondary | ICD-10-CM | POA: Diagnosis not present

## 2017-08-14 DIAGNOSIS — F419 Anxiety disorder, unspecified: Secondary | ICD-10-CM | POA: Insufficient documentation

## 2017-08-14 DIAGNOSIS — C20 Malignant neoplasm of rectum: Secondary | ICD-10-CM | POA: Insufficient documentation

## 2017-08-14 DIAGNOSIS — M1990 Unspecified osteoarthritis, unspecified site: Secondary | ICD-10-CM | POA: Diagnosis not present

## 2017-08-14 DIAGNOSIS — F17200 Nicotine dependence, unspecified, uncomplicated: Secondary | ICD-10-CM | POA: Diagnosis not present

## 2017-08-14 DIAGNOSIS — Z7983 Long term (current) use of bisphosphonates: Secondary | ICD-10-CM | POA: Diagnosis not present

## 2017-08-14 DIAGNOSIS — C218 Malignant neoplasm of overlapping sites of rectum, anus and anal canal: Secondary | ICD-10-CM | POA: Diagnosis not present

## 2017-08-14 DIAGNOSIS — Z791 Long term (current) use of non-steroidal anti-inflammatories (NSAID): Secondary | ICD-10-CM | POA: Insufficient documentation

## 2017-08-14 DIAGNOSIS — F329 Major depressive disorder, single episode, unspecified: Secondary | ICD-10-CM | POA: Insufficient documentation

## 2017-08-14 DIAGNOSIS — E7800 Pure hypercholesterolemia, unspecified: Secondary | ICD-10-CM | POA: Insufficient documentation

## 2017-08-14 DIAGNOSIS — E785 Hyperlipidemia, unspecified: Secondary | ICD-10-CM | POA: Insufficient documentation

## 2017-08-14 DIAGNOSIS — F418 Other specified anxiety disorders: Secondary | ICD-10-CM | POA: Diagnosis not present

## 2017-08-14 HISTORY — PX: FLEXIBLE SIGMOIDOSCOPY: SHX5431

## 2017-08-14 SURGERY — SIGMOIDOSCOPY, FLEXIBLE
Anesthesia: Monitor Anesthesia Care

## 2017-08-14 MED ORDER — PROPOFOL 10 MG/ML IV BOLUS
INTRAVENOUS | Status: AC
  Filled 2017-08-14: qty 20

## 2017-08-14 MED ORDER — PROPOFOL 500 MG/50ML IV EMUL
INTRAVENOUS | Status: DC | PRN
  Administered 2017-08-14: 250 ug/kg/min via INTRAVENOUS

## 2017-08-14 MED ORDER — SPOT INK MARKER SYRINGE KIT
PACK | SUBMUCOSAL | Status: AC
Start: 2017-08-14 — End: ?
  Filled 2017-08-14: qty 5

## 2017-08-14 MED ORDER — LACTATED RINGERS IV SOLN
INTRAVENOUS | Status: DC | PRN
  Administered 2017-08-14: 13:00:00 via INTRAVENOUS

## 2017-08-14 NOTE — Op Note (Signed)
[facility] Patient Name: [name] Procedure Date: 08/14/2017 MRN: [id number] Attending MD: Ileana Roup MD, MD Date of Birth: 1960/07/12 CSN: [id number] Age: 57 Admit Type: Outpatient Procedure:                Flexible Sigmoidoscopy Indications:              Preoperative assessment Providers:                Sharon Mt. Dawnelle Warman MD, MD, Presley Raddle, RN,                            William Dalton, Technician Referring MD:              Medicines:                Monitored Anesthesia Care Complications:            No immediate complications. Estimated Blood Loss:     Estimated blood loss: none. Procedure:                Pre-Anesthesia Assessment:                           - Prior to the procedure, a History and Physical                            was performed, and patient medications, allergies                            and sensitivities were reviewed. The patient's                            tolerance of previous anesthesia was reviewed.                           - The risks and benefits of the procedure and the                            sedation options and risks were discussed with the                            patient. All questions were answered and informed                            consent was obtained.                           - Patient identification and proposed procedure                            were verified prior to the procedure by the                            physician, the nurse and the anesthetist.                           - ASA Grade Assessment: II - A patient  with mild                            systemic disease.                           After obtaining informed consent, the scope was                            passed under direct vision. The EC-[id number] ([id number])                            scope was introduced through the anus and advanced                            to the the sigmoid colon. The flexible      sigmoidoscopy was accomplished without difficulty.                            The patient tolerated the procedure well. The                            quality of the bowel preparation was adequate. Scope In: 1:18:33 PM Scope Out: 1:36:36 PM Total Procedure Duration: 0 hours 18 minutes 3 seconds  Findings:      The perianal and digital rectal examinations were normal.      A tattoo was seen in the proximal rectum.      A flat/sessile area of polypoid mucosa on the 3rd valve/rectosigmoid       fold was found - this was 11cm from the anal verge by flexible endoscopy       and located on the 3rd valve which also corresponded to a tight turn       presumably to the sigmoid colon. The candidate lesion was also on the       back side of the 3rd valve. The lesion was sessile. No bleeding was       present. No biopsies or other specimens were collected for this exam. Impression:               - No specimens collected. Moderate Sedation:      N/A- Per Anesthesia Care Recommendation:           - Resume previous diet.                           - Repeat flexible sigmoidoscopy in 1 year for                            surveillance.                           - Pelvic MRI with contrast; return to my office for                            further counseling and coordination of care  following MRI Procedure Code(s):        --- Professional ---                           [phone], Sigmoidoscopy, flexible; diagnostic,                            including collection of specimen(s) by brushing or                            washing, when performed (separate procedure) Diagnosis Code(s):        --- Professional ---                           U72.761, Encounter for other preprocedural                            examination CPT copyright 2017 American Medical Association. All rights reserved. The codes documented in this report are preliminary and upon coder review may  be revised to  meet current compliance requirements. Nadeen Landau, MD Ileana Roup MD, MD 08/14/2017 1:55:54 PM This report has been signed electronically. Number of Addenda: 0

## 2017-08-14 NOTE — Telephone Encounter (Signed)
Appointment scheduled patient notified and will pick up new schedule at next visit per 6/19 referral

## 2017-08-14 NOTE — H&P (Signed)
H&P Update  H&P from yesterday, 08/13/17 reviewed by myself with the patient; she reports no interval changes to her health or history. She denies any complaints. She is ready for her procedure.  There were no vitals filed for this visit.   A/P [name] is a very pleasant 43yoF with hx of HLD - newly diagnosed rectal cancer, estimated to be 15 cm from the anal verge based on endoscopist note but there is discordance with physical examination; location will impact potential approaches to treatment  -Will proceed with flexible sigmoidoscopy today for further evaluation and characterization, possible tattoo distally -The anatomy and physiology of the GI tract was discussed at length with the patient. The pathophysiology of colorectal cancer was discussed at length with associated pictures and illustrations. -The planned procedure, material risks (including, but not limited to, pain, bleeding, perforation, need for additional procedures,  pneumonia, heart attack, stroke, death) benefits and alternatives to the procedure were discussed at length. The patient's questions were answered to her satisfaction, she voiced understanding and elected to proceed with the procedure  Sharon Mt. Dema Severin, M.D. General and Colorectal Surgery [facility]

## 2017-08-14 NOTE — Transfer of Care (Signed)
Immediate Anesthesia Transfer of Care Note  Patient: [name]  Procedure(s) Performed: FLEXIBLE SIGMOIDOSCOPY (N/A )  Patient Location: PACU  Anesthesia Type:MAC  Level of Consciousness: awake, alert , oriented and patient cooperative  Airway & Oxygen Therapy: Patient Spontanous Breathing and Patient connected to face mask oxygen  Post-op Assessment: Report given to RN, Post -op Vital signs reviewed and stable and Patient moving all extremities X 4  Post vital signs: stable  Last Vitals:  Vitals Value Taken Time  BP 119/70 08/14/2017  1:50 PM  Temp 36.6 C 08/14/2017  1:43 PM  Pulse 64 08/14/2017  1:54 PM  Resp 19 08/14/2017  1:54 PM  SpO2 98 % 08/14/2017  1:54 PM  Vitals shown include unvalidated device data.  Last Pain:  Vitals:   08/14/17 1343  TempSrc: Oral  PainSc: 0-No pain         Complications: No apparent anesthesia complications

## 2017-08-14 NOTE — Anesthesia Postprocedure Evaluation (Signed)
Anesthesia Post Note  Patient: DIA DONATE  Procedure(s) Performed: FLEXIBLE SIGMOIDOSCOPY (N/A )     Patient location during evaluation: PACU Anesthesia Type: MAC Level of consciousness: awake and alert Pain management: pain level controlled Vital Signs Assessment: post-procedure vital signs reviewed and stable Respiratory status: spontaneous breathing, nonlabored ventilation, respiratory function stable and patient connected to nasal cannula oxygen Cardiovascular status: stable and blood pressure returned to baseline Postop Assessment: no apparent nausea or vomiting Anesthetic complications: no    Last Vitals:  Vitals:   08/14/17 1353 08/14/17 1402  BP: 119/70 (!) 151/71  Pulse: 65 73  Resp: 16 19  Temp:    SpO2: 98% 97%    Last Pain:  Vitals:   08/14/17 1402  TempSrc:   PainSc: 0-No pain                 Mozes Sagar

## 2017-08-14 NOTE — Anesthesia Procedure Notes (Signed)
Procedure Name: MAC Date/Time: 08/14/2017 1:17 PM Performed by: Lissa Morales, CRNA Pre-anesthesia Checklist: Patient identified, Emergency Drugs available, Suction available, Patient being monitored and Timeout performed Patient Re-evaluated:Patient Re-evaluated prior to induction Oxygen Delivery Method: Simple face mask Placement Confirmation: positive ETCO2

## 2017-08-14 NOTE — Anesthesia Preprocedure Evaluation (Addendum)
Anesthesia Evaluation  Patient identified by MRN, date of birth, ID band Patient awake    Reviewed: Allergy & Precautions, NPO status , Patient's Chart, lab work & pertinent test results  History of Anesthesia Complications (+) PROLONGED EMERGENCE and history of anesthetic complications  Airway Mallampati: II  TM Distance: >3 FB Neck ROM: Full    Dental no notable dental hx. (+) Chipped, Caps,    Pulmonary Current Smoker,    Pulmonary exam normal breath sounds clear to auscultation       Cardiovascular Normal cardiovascular exam Rhythm:Regular Rate:Normal     Neuro/Psych PSYCHIATRIC DISORDERS Anxiety Depression    GI/Hepatic   Endo/Other    Renal/GU      Musculoskeletal  (+) Arthritis , Osteoarthritis,    Abdominal   Peds  Hematology negative hematology ROS (+)   Anesthesia Other Findings   Reproductive/Obstetrics                           Anesthesia Physical  Anesthesia Plan  ASA: II  Anesthesia Plan: MAC   Post-op Pain Management:    Induction:   PONV Risk Score and Plan: 2 and Dexamethasone, Ondansetron and Treatment may vary due to age or medical condition  Airway Management Planned: Nasal Cannula and Natural Airway  Additional Equipment:   Intra-op Plan:   Post-operative Plan:   Informed Consent: I have reviewed the patients History and Physical, chart, labs and discussed the procedure including the risks, benefits and alternatives for the proposed anesthesia with the patient or authorized representative who has indicated his/her understanding and acceptance.   Dental advisory given  Plan Discussed with: CRNA, Anesthesiologist and Surgeon  Anesthesia Plan Comments:        Anesthesia Quick Evaluation

## 2017-08-15 ENCOUNTER — Other Ambulatory Visit: Payer: Self-pay | Admitting: Surgery

## 2017-08-15 ENCOUNTER — Encounter (HOSPITAL_COMMUNITY): Payer: Self-pay | Admitting: Surgery

## 2017-08-15 DIAGNOSIS — C20 Malignant neoplasm of rectum: Secondary | ICD-10-CM

## 2017-08-20 NOTE — Progress Notes (Signed)
Spoke with Dr. Dema Severin and initial consult on 08/21/17  will be canceled and will be rescheduled once  Dr. Dema Severin has MRI results back. Called pt and she voiced agreement with plan.

## 2017-08-21 ENCOUNTER — Ambulatory Visit: Payer: BLUE CROSS/BLUE SHIELD | Admitting: Oncology

## 2017-08-22 ENCOUNTER — Ambulatory Visit
Admission: RE | Admit: 2017-08-22 | Discharge: 2017-08-22 | Disposition: A | Discharge status: Home / Self Care | Payer: BLUE CROSS/BLUE SHIELD | Source: Ambulatory Visit | Attending: Surgery | Admitting: Surgery

## 2017-08-22 DIAGNOSIS — C20 Malignant neoplasm of rectum: Secondary | ICD-10-CM | POA: Diagnosis not present

## 2017-08-22 MED ORDER — GADOBENATE DIMEGLUMINE 529 MG/ML IV SOLN
10.0000 mL | Freq: Once | INTRAVENOUS | Status: AC | PRN
  Administered 2017-08-22: 10 mL via INTRAVENOUS

## 2017-08-23 ENCOUNTER — Other Ambulatory Visit: Payer: BLUE CROSS/BLUE SHIELD

## 2017-08-25 ENCOUNTER — Ambulatory Visit: Payer: Self-pay | Admitting: Surgery

## 2017-08-25 DIAGNOSIS — C20 Malignant neoplasm of rectum: Secondary | ICD-10-CM | POA: Diagnosis not present

## 2017-08-25 NOTE — H&P (Signed)
CC: cT1/2 N0 M0 upper rectal cancer  HPI: [name] is a very pleasant 57yoF with hx of diet controlled HLD presented to the office for evaluation of a newly diagnosed rectal cancer. She has never had a colonoscopy prior to the one in early June with Dr. Alessandra Bevels. This was performed for rectal bleeding which she had had for 6-7 months.  She reports that she does have some urinary urgency and occasionally incontinent episodes with coughing. She also reports occasional incontinence to gas. She has liquid stool accidents 1-2 times per month but does not wear a pad or diaper and states this is not interfering with her quality of life or alter her daily activities. She denies any incontinence to solid stool.  Colonoscopy is significant for: -5 mm to adenoma removed from the sigmoid -30 mm polyp estimated to be 15 cm from the anus, multilobulated, semi-pedunculated, partially removed with hot snare in piecemeal fashion and tattooed. 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 PT 1 NX MX-IHC being performed separately.  She underwent staging CT chest abdomen and pelvis which demonstrated no evidence of metastatic disease  MRI Pelvis 08/22/17 showed a short segment circumferential wall thickening of the upper/mid rectum, likely corresponding to patient's suspected rectal cancer-T1 or T2, N0, distance from tumor to anal sphincter by direct measurement (not down the barrel of lumen per se) is 9 cm  Flex sig by myself 08/14/17 showed a flat/sessile area of polypoid mucosa on the third valve/rectosigmoid fold which was approximately 11 cm from the anal verge by flexible endoscopy and located on the third valve which also corresponded to a tight turn, presumably the sigmoid. The candidate lesion was on the backside of the third valve as well which would limit/preclude any  attempts at TAMIS  CEA: Pending   OB/GYN: G5-6P2; she did have one C-section and one vaginal delivery. Her vaginal delivery was complicated by a "bad tear" that she states required repair of her anus as well.  PMH: HLD well controlled with diet  PSH: C-section 1 via Pfannenstiel; left thumb surgeries. Denies any prior abdominal surgeries aside from the C-section.  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 She works in Aeronautical engineer for [employer]: A comprehensive 10 system review of systems was completed with the patient and pertinent findings as noted above.  The patient is a 57 year old female.   Allergies Alean Rinne, [place]; 08/25/2017 9:19 AM) No Known Drug Allergies [08/12/2017]: Allergies Reconciled   Medication History Alean Rinne, [place]; 08/25/2017 9:19 AM) Calcium (250MG Tablet, Oral) Active. Cymbalta (60MG Capsule DR Part, Oral) Active. Folic Acid (1MG Tablet, Oral) Active. Fosamax (70MG Tablet, Oral) Active. Ibuprofen (600MG Tablet, Oral) Active. Lysine (500MG Tablet, Oral) Active. Trintellix (10MG Tablet, Oral) Active. Vitamin D (Ergocalciferol) (50000UNIT Capsule, Oral) Active. ALPRAZolam (1MG Tablet, Oral) Active. Humira Pen (40MG/0.8ML Pen-inj Kit, Subcutaneous) Active. Methotrexate (2.5MG Tablet, Oral) Active. Medications Reconciled    Review of Systems Harrell Gave M. Munir Victorian MD; 08/25/2017 9:52 AM) General Present- Appetite Loss, Fatigue and Weight Loss. Not Present- Chills, Fever, Night Sweats and Weight Gain. Skin Not Present- Change in Wart/Mole, Dryness, Hives, Jaundice, New Lesions, Non-Healing Wounds, Rash and Ulcer. HEENT Present- Ringing in the Ears and Wears glasses/contact lenses. Not  Present- Earache, Hearing Loss, Hoarseness, Nose Bleed, Oral  Ulcers, Seasonal Allergies, Sinus Pain, Sore Throat, Visual Disturbances and Yellow Eyes. Respiratory Present- Chronic Cough. Not Present- Bloody sputum, Difficulty Breathing, Snoring and Wheezing. Breast Not Present- Breast Mass, Breast Pain, Nipple Discharge and Skin Changes. Cardiovascular Not Present- Chest Pain, Difficulty Breathing Lying Down, Leg Cramps, Palpitations, Rapid Heart Rate, Shortness of Breath and Swelling of Extremities. Gastrointestinal Present- Bloody Stool and Nausea. Not Present- Abdominal Pain, Bloating, Change in Bowel Habits, Chronic diarrhea, Constipation, Difficulty Swallowing, Excessive gas, Gets full quickly at meals, Hemorrhoids, Indigestion, Rectal Pain and Vomiting. Female Genitourinary Not Present- Frequency, Nocturia, Painful Urination, Pelvic Pain and Urgency. Musculoskeletal Present- Joint Pain and Swelling of Extremities. Not Present- Back Pain, Joint Stiffness, Muscle Pain and Muscle Weakness. Neurological Not Present- Decreased Memory, Fainting, Headaches, Numbness, Seizures, Tingling, Tremor, Trouble walking and Weakness. Psychiatric Present- Anxiety, Change in Sleep Pattern and Depression. Not Present- Bipolar, Fearful and Frequent crying. Endocrine Present- Cold Intolerance and Hair Changes. Not Present- Excessive Hunger, Heat Intolerance, Hot flashes and New Diabetes. Hematology Present- Easy Bruising. Not Present- Blood Clots, Blood Thinners, Excessive bleeding, Gland problems, HIV and Persistent Infections.  Vitals Mardene Celeste King RMA; 08/25/2017 9:19 AM) 08/25/2017 9:19 AM Weight: 110 lb Height: 62in Body Surface Area: 1.48 m Body Mass Index: 20.12 kg/m  Temp.: 98.99F  Pulse: 98 (Regular)  BP: 110/72 (Sitting, Left Arm, Standard)       Physical Exam Harrell Gave M. Kieley Akter MD; 08/25/2017 9:53 AM) The physical exam findings are as follows: Note:Constitutional: No acute distress; conversant; no deformities Eyes: Moist conjunctiva; no lid  lag; anicteric sclerae; pupils equal round and reactive to light Neck: Trachea midline; no palpable thyromegaly Lungs: Normal respiratory effort; no tactile fremitus CV: Regular rate and rhythm; no palpable thrill; no pitting edema GI: Abdomen soft, nontender, nondistended; no palpable hepatosplenomegaly MSK: Normal gait; no clubbing/cyanosis Psychiatric: Appropriate affect; alert and oriented 3 Lymphatic: No palpable cervical or axillary lymphadenopathy    Assessment & Plan Harrell Gave M. Juquan Reznick MD; 08/25/2017 9:58 AM) RECTAL CANCER (C20) Story: Ms. Laumann is a very pleasant 54yoF with hx of HLD - newly diagnosed rectal cancer - cT1/2 N0 M0 which is 11cm from anal verge by flexible endoscopy, on the 3rd valve extending into rectosigmoid as well. Impression: -Planning to present at MDT this Wednesday; given location and apparent early clinical stage, anticipate upfront surgery so will proceed at least with scheduling so as to avoid any delays -CEA pending -The anatomy and physiology of the GI tract was discussed at length with the patient. The pathophysiology of colorectal cancer was discussed at length with associated pictures. -The planned procedure, material risks (including, but not limited to, pain, bleeding, infection, scarring, need for blood transfusion, conversion to open operation, damage to surrounding structures- blood vessels/nerves/viscus/organs, damage to ureter, urine leak, leak from anastomosis, need for additional procedures, need for stoma which may be permanent, worsening of incontinence to the point of lifestyle/job limiting levels - particularly in her case given issues with gas and occasionally to liquid stool, hernia, DVT/PE, recurrence, pneumonia, heart attack, stroke, death) benefits and alternatives to surgery were discussed at length. The patient's questions were answered to her satisfaction, she voiced understanding and elected to proceed with surgery. Additionally, we  discussed typical postoperative expectations and the recovery process. -50 minutes was spent face to face with the patient, >50% of which was spent in direct counseling  Signed electronically by Ileana Roup, MD (08/25/2017 9:59 AM)

## 2017-09-18 NOTE — Patient Instructions (Addendum)
[name]  09/18/2017   Your procedure is scheduled on: 09-24-17   Report to [facility] Main  Entrance    Report to Admitting at 6:30  AM    Call this number if you have problems the morning of surgery [phone]    Remember: DRINK 2 PRESURGERY ENSURE DRINKS THE NIGHT BEFORE SURGERY AT  1000 PM AND 1 PRESURGERY DRINK THE DAY OF THE PROCEDURE 3 HOURS PRIOR TO SCHEDULED SURGERY. NO SOLIDS AFTER MIDNIGHT THE DAY PRIOR TO THE SURGERY. NOTHING BY MOUTH EXCEPT CLEAR LIQUIDS UNTIL THREE HOURS PRIOR TO SCHEDULED SURGERY. PLEASE FINISH PRESURGERY ENSURE DRINK PER SURGEON ORDER 3 HOURS PRIOR TO SCHEDULED SURGERY TIME WHICH NEEDS TO BE COMPLETED AT 5:30AM.    CLEAR LIQUID DIET   Foods Allowed                                                                     Foods Excluded  Coffee and tea, regular and decaf                             liquids that you cannot  Plain Jell-O in any flavor                                             see through such as: Fruit ices (not with fruit pulp)                                     milk, soups, orange juice  Iced Popsicles                                    All solid food Carbonated beverages, regular and diet                                    Cranberry, grape and apple juices Sports drinks like Gatorade Lightly seasoned clear broth or consume(fat free) Sugar, honey syrup  Sample Menu Breakfast                                Lunch                                     Supper Cranberry juice                    Beef broth                            Chicken broth Jell-O  Grape juice                           Apple juice Coffee or tea                        Jell-O                                      Popsicle                                                Coffee or tea                        Coffee or tea  _____________________________________________________________________       Take  these medicines the morning of surgery with A SIP OF WATER: Alprazolam (Xanax)                                You may not have any metal on your body including hair pins and              piercings  Do not wear jewelry, make-up, lotions, powders or perfumes, deodorant             Do not wear nail polish.  Do not shave  48 hours prior to surgery.                Do not bring valuables to the hospital. [place].  Contacts, dentures or bridgework may not be worn into surgery.  Leave suitcase in the car. After surgery it may be brought to your room.    Special Instructions: N/A              Please read over the following fact sheets you were given: _____________________________________________________________________          [facility] - Preparing for Surgery Before surgery, you can play an important role.  Because skin is not sterile, your skin needs to be as free of germs as possible.  You can reduce the number of germs on your skin by washing with CHG (chlorahexidine gluconate) soap before surgery.  CHG is an antiseptic cleaner which kills germs and bonds with the skin to continue killing germs even after washing. Please DO NOT use if you have an allergy to CHG or antibacterial soaps.  If your skin becomes reddened/irritated stop using the CHG and inform your nurse when you arrive at Short Stay. Do not shave (including legs and underarms) for at least 48 hours prior to the first CHG shower.  You may shave your face/neck. Please follow these instructions carefully:  1.  Shower with CHG Soap the night before surgery and the  morning of Surgery.  2.  If you choose to wash your hair, wash your hair first as usual with your  normal  shampoo.  3.  After you shampoo, rinse your hair and body thoroughly to remove the  shampoo.  4.  Use CHG as you would any other liquid soap.  You can apply chg directly  to the skin and wash                        Gently with a scrungie or clean washcloth.  5.  Apply the CHG Soap to your body ONLY FROM THE NECK DOWN.   Do not use on face/ open                           Wound or open sores. Avoid contact with eyes, ears mouth and genitals (private parts).                       Wash face,  Genitals (private parts) with your normal soap.             6.  Wash thoroughly, paying special attention to the area where your surgery  will be performed.  7.  Thoroughly rinse your body with warm water from the neck down.  8.  DO NOT shower/wash with your normal soap after using and rinsing off  the CHG Soap.                9.  Pat yourself dry with a clean towel.            10.  Wear clean pajamas.            11.  Place clean sheets on your bed the night of your first shower and do not  sleep with pets. Day of Surgery : Do not apply any lotions/deodorants the morning of surgery.  Please wear clean clothes to the hospital/surgery center.  FAILURE TO FOLLOW THESE INSTRUCTIONS MAY RESULT IN THE CANCELLATION OF YOUR SURGERY PATIENT SIGNATURE_________________________________  NURSE SIGNATURE__________________________________  ________________________________________________________________________   [name]  An incentive spirometer is a tool that can help keep your lungs clear and active. This tool measures how well you are filling your lungs with each breath. Taking long deep breaths may help reverse or decrease the chance of developing breathing (pulmonary) problems (especially infection) following:  A long period of time when you are unable to move or be active. BEFORE THE PROCEDURE   If the spirometer includes an indicator to show your best effort, your nurse or respiratory therapist will set it to a desired goal.  If possible, sit up straight or lean slightly forward. Try not to slouch.  Hold the incentive spirometer in an upright position. INSTRUCTIONS FOR USE  1. Sit on  the edge of your bed if possible, or sit up as far as you can in bed or on a chair. 2. Hold the incentive spirometer in an upright position. 3. Breathe out normally. 4. Place the mouthpiece in your mouth and seal your lips tightly around it. 5. Breathe in slowly and as deeply as possible, raising the piston or the ball toward the top of the column. 6. Hold your breath for 3-5 seconds or for as long as possible. Allow the piston or ball to fall to the bottom of the column. 7. Remove the mouthpiece from your mouth and breathe out normally. 8. Rest for a few seconds and repeat Steps 1 through 7 at least 10 times every 1-2 hours when you are awake. Take your time and take a few normal breaths between deep breaths. 9. The spirometer may include an indicator to  show your best effort. Use the indicator as a goal to work toward during each repetition. 10. After each set of 10 deep breaths, practice coughing to be sure your lungs are clear. If you have an incision (the cut made at the time of surgery), support your incision when coughing by placing a pillow or rolled up towels firmly against it. Once you are able to get out of bed, walk around indoors and cough well. You may stop using the incentive spirometer when instructed by your caregiver.  RISKS AND COMPLICATIONS  Take your time so you do not get dizzy or light-headed.  If you are in pain, you may need to take or ask for pain medication before doing incentive spirometry. It is harder to take a deep breath if you are having pain. AFTER USE  Rest and breathe slowly and easily.  It can be helpful to keep track of a log of your progress. Your caregiver can provide you with a simple table to help with this. If you are using the spirometer at home, follow these instructions: [place] IF:   You are having difficultly using the spirometer.  You have trouble using the spirometer as often as instructed.  Your pain medication is not giving  enough relief while using the spirometer.  You develop fever of 100.5 F (38.1 C) or higher. SEEK IMMEDIATE MEDICAL CARE IF:   You cough up bloody sputum that had not been present before.  You develop fever of 102 F (38.9 C) or greater.  You develop worsening pain at or near the incision site. MAKE SURE YOU:   Understand these instructions.  Will watch your condition.  Will get help right away if you are not doing well or get worse. Document Released: 06/24/2006 Document Revised: 05/06/2011 Document Reviewed: 08/25/2006 Exit[employer] Patient Information 2014 Exit[employer], [place].   ________________________________________________________________________  WHAT IS A BLOOD TRANSFUSION? Blood Transfusion Information  A transfusion is the replacement of blood or some of its parts. Blood is made up of multiple cells which provide different functions.  Red blood cells carry oxygen and are used for blood loss replacement.  White blood cells fight against infection.  Platelets control bleeding.  Plasma helps clot blood.  Other blood products are available for specialized needs, such as hemophilia or other clotting disorders. BEFORE THE TRANSFUSION  Who gives blood for transfusions?   Healthy volunteers who are fully evaluated to make sure their blood is safe. This is blood bank blood. Transfusion therapy is the safest it has ever been in the practice of medicine. Before blood is taken from a donor, a complete history is taken to make sure that person has no history of diseases nor engages in risky social behavior (examples are intravenous drug use or sexual activity with multiple partners). The donor's travel history is screened to minimize risk of transmitting infections, such as malaria. The donated blood is tested for signs of infectious diseases, such as HIV and hepatitis. The blood is then tested to be sure it is compatible with you in order to minimize the chance of a transfusion  reaction. If you or a relative donates blood, this is often done in anticipation of surgery and is not appropriate for emergency situations. It takes many days to process the donated blood. RISKS AND COMPLICATIONS Although transfusion therapy is very safe and saves many lives, the main dangers of transfusion include:   Getting an infectious disease.  Developing a transfusion reaction. This is an allergic reaction  to something in the blood you were given. Every precaution is taken to prevent this. The decision to have a blood transfusion has been considered carefully by your caregiver before blood is given. Blood is not given unless the benefits outweigh the risks. AFTER THE TRANSFUSION  Right after receiving a blood transfusion, you will usually feel much better and more energetic. This is especially true if your red blood cells have gotten low (anemic). The transfusion raises the level of the red blood cells which carry oxygen, and this usually causes an energy increase.  The nurse administering the transfusion will monitor you carefully for complications. HOME CARE INSTRUCTIONS  No special instructions are needed after a transfusion. You may find your energy is better. Speak with your caregiver about any limitations on activity for underlying diseases you may have. SEEK MEDICAL CARE IF:   Your condition is not improving after your transfusion.  You develop redness or irritation at the intravenous (IV) site. SEEK IMMEDIATE MEDICAL CARE IF:  Any of the following symptoms occur over the next 12 hours:  Shaking chills.  You have a temperature by mouth above 102 F (38.9 C), not controlled by medicine.  Chest, back, or muscle pain.  People around you feel you are not acting correctly or are confused.  Shortness of breath or difficulty breathing.  Dizziness and fainting.  You get a rash or develop hives.  You have a decrease in urine output.  Your urine turns a dark color or  changes to pink, red, or brown. Any of the following symptoms occur over the next 10 days:  You have a temperature by mouth above 102 F (38.9 C), not controlled by medicine.  Shortness of breath.  Weakness after normal activity.  The white part of the eye turns yellow (jaundice).  You have a decrease in the amount of urine or are urinating less often.  Your urine turns a dark color or changes to pink, red, or brown. Document Released: 02/09/2000 Document Revised: 05/06/2011 Document Reviewed: 09/28/2007 [facility] Patient Information 2014 [place], [place].  _______________________________________________________________________

## 2017-09-18 NOTE — Progress Notes (Signed)
08-07-17 (Epic) CT Chest w/contrast

## 2017-09-19 ENCOUNTER — Encounter (HOSPITAL_COMMUNITY)
Admission: RE | Admit: 2017-09-19 | Discharge: 2017-09-19 | Disposition: A | Discharge status: Home / Self Care | Payer: BLUE CROSS/BLUE SHIELD | Source: Ambulatory Visit | Attending: Surgery | Admitting: Surgery

## 2017-09-19 ENCOUNTER — Other Ambulatory Visit: Payer: Self-pay

## 2017-09-19 ENCOUNTER — Encounter (HOSPITAL_COMMUNITY): Payer: Self-pay

## 2017-09-19 DIAGNOSIS — C20 Malignant neoplasm of rectum: Secondary | ICD-10-CM | POA: Insufficient documentation

## 2017-09-19 DIAGNOSIS — Z01818 Encounter for other preprocedural examination: Secondary | ICD-10-CM | POA: Diagnosis not present

## 2017-09-19 DIAGNOSIS — Z0183 Encounter for blood typing: Secondary | ICD-10-CM | POA: Insufficient documentation

## 2017-09-19 DIAGNOSIS — R9431 Abnormal electrocardiogram [ECG] [EKG]: Secondary | ICD-10-CM | POA: Diagnosis not present

## 2017-09-19 DIAGNOSIS — Z01812 Encounter for preprocedural laboratory examination: Secondary | ICD-10-CM | POA: Diagnosis not present

## 2017-09-19 LAB — COMPREHENSIVE METABOLIC PANEL
ALT: 21 U/L (ref 0–44)
AST: 26 U/L (ref 15–41)
Albumin: 5 g/dL (ref 3.5–5.0)
Alkaline Phosphatase: 54 U/L (ref 38–126)
Anion gap: 9 (ref 5–15)
BUN: 9 mg/dL (ref 6–20)
CO2: 30 mmol/L (ref 22–32)
Calcium: 10.1 mg/dL (ref 8.9–10.3)
Chloride: 103 mmol/L (ref 98–111)
Creatinine, Ser: 0.84 mg/dL (ref 0.44–1.00)
GFR calc Af Amer: >60 mL/min (ref >60)
GFR calc non Af Amer: >60 mL/min (ref >60)
Glucose, Bld: 100 mg/dL — ABNORMAL HIGH (ref 70–99)
Potassium: 4.5 mmol/L (ref 3.5–5.1)
Sodium: 142 mmol/L (ref 135–145)
Total Bilirubin: 1 mg/dL (ref 0.3–1.2)
Total Protein: 7.9 g/dL (ref 6.5–8.1)

## 2017-09-19 LAB — HEMOGLOBIN A1C
Hgb A1c MFr Bld: 5.6 % (ref 4.8–5.6)
Mean Plasma Glucose: 114.02 mg/dL

## 2017-09-19 LAB — CBC WITH DIFFERENTIAL/PLATELET
Basophils Absolute: 0 10*3/uL (ref 0.0–0.1)
Basophils Relative: 0 %
Eosinophils Absolute: 0.1 10*3/uL (ref 0.0–0.7)
Eosinophils Relative: 1 %
HCT: 44.2 % (ref 36.0–46.0)
Hemoglobin: 14.8 g/dL (ref 12.0–15.0)
Lymphocytes Relative: 40 %
Lymphs Abs: 3.3 10*3/uL (ref 0.7–4.0)
MCH: 32.7 pg (ref 26.0–34.0)
MCHC: 33.5 g/dL (ref 30.0–36.0)
MCV: 97.6 fL (ref 78.0–100.0)
Monocytes Absolute: 0.5 10*3/uL (ref 0.1–1.0)
Monocytes Relative: 6 %
Neutro Abs: 4.4 10*3/uL (ref 1.7–7.7)
Neutrophils Relative %: 53 %
Platelets: 255 10*3/uL (ref 150–400)
RBC: 4.53 MIL/uL (ref 3.87–5.11)
RDW: 14 % (ref 11.5–15.5)
WBC: 8.4 10*3/uL (ref 4.0–10.5)

## 2017-09-19 LAB — PROTIME-INR
INR: 0.87
Prothrombin Time: 11.8 seconds (ref 11.4–15.2)

## 2017-09-19 LAB — APTT: aPTT: 23 seconds — ABNORMAL LOW (ref 24–36)

## 2017-09-19 LAB — ABO/RH: ABO/RH(D): O POS

## 2017-09-19 NOTE — Progress Notes (Signed)
PAT chart with Elwin Sleight, RN. Awaiting EKG, and CEA

## 2017-09-19 NOTE — Progress Notes (Signed)
Called patient to offer encouragement and support prior to surgery on 09/24/17. Patient requested that I reschedule her genetics appointment that is scheduled for 09/23/17.  Message sent to scheduling.

## 2017-09-19 NOTE — Consult Note (Signed)
[place] Nurse requested for preoperative stoma site marking  Discussed surgical procedure and stoma creation with patient and family.  Explained role of the [place] nurse team.  Provided the patient with educational booklet and provided samples of pouching options.  Answered patient and family questions.   Examined patient lying, sitting, and standing in order to place the marking in the patient's visual field, away from any creases or abdominal contour issues and within the rectus muscle.  Attempted to mark below the patient's belt line.    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   Patient's abdomen cleansed with CHG wipes at site markings, allowed to air dry prior to marking.Covered mark with thin film transparent dressing to preserve mark until date of surgery.   [place] Nurse team will follow up with patient after surgery for continue ostomy care and teaching.   Domenic Moras RN BSN [place] Pager [phone]

## 2017-09-20 LAB — CEA: CEA: 6.8 ng/mL — ABNORMAL HIGH (ref 0.0–4.7)

## 2017-09-22 ENCOUNTER — Telehealth: Payer: Self-pay | Admitting: Genetics

## 2017-09-22 NOTE — Pre-Procedure Instructions (Signed)
CEA results 09/19/17 faxed to Dr. Dema Severin via epic.

## 2017-09-22 NOTE — Telephone Encounter (Signed)
Per 7/26 sch msg, called patient with Gen Coun appt for 8/22.  Mailed calendar.

## 2017-09-23 ENCOUNTER — Inpatient Hospital Stay: Payer: BLUE CROSS/BLUE SHIELD

## 2017-09-23 ENCOUNTER — Inpatient Hospital Stay: Payer: BLUE CROSS/BLUE SHIELD | Admitting: Genetics

## 2017-09-23 NOTE — Anesthesia Preprocedure Evaluation (Addendum)
Anesthesia Evaluation  Patient identified by MRN, date of birth, ID band Patient awake    Reviewed: Allergy & Precautions, NPO status , Patient's Chart, lab work & pertinent test results  History of Anesthesia Complications (+) history of anesthetic complications  Airway Mallampati: II  TM Distance: >3 FB Neck ROM: Full    Dental  (+) Chipped,    Pulmonary Current Smoker,    Pulmonary exam normal breath sounds clear to auscultation       Cardiovascular negative cardio ROS Normal cardiovascular exam Rhythm:Regular Rate:Normal  ECG: NSR, rate 67   Neuro/Psych PSYCHIATRIC DISORDERS Anxiety Depression negative neurological ROS     GI/Hepatic negative GI ROS, Neg liver ROS,   Endo/Other  negative endocrine ROS  Renal/GU negative Renal ROS     Musculoskeletal  (+) Arthritis , Rheumatoid disorders,    Abdominal   Peds  Hematology negative hematology ROS (+)   Anesthesia Other Findings RECTAL CANCER  Reproductive/Obstetrics                           Anesthesia Physical Anesthesia Plan  ASA: II  Anesthesia Plan: General   Post-op Pain Management:    Induction: Intravenous  PONV Risk Score and Plan: 3 and Ondansetron, Dexamethasone, Midazolam, Scopolamine patch - Pre-op and Treatment may vary due to age or medical condition  Airway Management Planned: Oral ETT  Additional Equipment:   Intra-op Plan:   Post-operative Plan: Extubation in OR  Informed Consent: I have reviewed the patients History and Physical, chart, labs and discussed the procedure including the risks, benefits and alternatives for the proposed anesthesia with the patient or authorized representative who has indicated his/her understanding and acceptance.   Dental advisory given  Plan Discussed with: CRNA  Anesthesia Plan Comments:         Anesthesia Quick Evaluation

## 2017-09-24 ENCOUNTER — Inpatient Hospital Stay (HOSPITAL_COMMUNITY)
Admission: RE | Admit: 2017-09-24 | Discharge: 2017-09-27 | DRG: 331 | Disposition: A | Discharge status: Home / Self Care | Payer: BLUE CROSS/BLUE SHIELD | Source: Ambulatory Visit | Attending: Surgery | Admitting: Surgery

## 2017-09-24 ENCOUNTER — Inpatient Hospital Stay (HOSPITAL_COMMUNITY): Payer: BLUE CROSS/BLUE SHIELD | Admitting: Anesthesiology

## 2017-09-24 ENCOUNTER — Other Ambulatory Visit: Payer: Self-pay

## 2017-09-24 ENCOUNTER — Encounter (HOSPITAL_COMMUNITY): Payer: Self-pay | Admitting: *Deleted

## 2017-09-24 ENCOUNTER — Encounter (HOSPITAL_COMMUNITY)
Admission: RE | Disposition: A | Discharge status: Home / Self Care | Payer: Self-pay | Source: Ambulatory Visit | Attending: Surgery

## 2017-09-24 DIAGNOSIS — E785 Hyperlipidemia, unspecified: Secondary | ICD-10-CM | POA: Diagnosis not present

## 2017-09-24 DIAGNOSIS — F17210 Nicotine dependence, cigarettes, uncomplicated: Secondary | ICD-10-CM | POA: Diagnosis present

## 2017-09-24 DIAGNOSIS — Z800 Family history of malignant neoplasm of digestive organs: Secondary | ICD-10-CM | POA: Diagnosis not present

## 2017-09-24 DIAGNOSIS — F329 Major depressive disorder, single episode, unspecified: Secondary | ICD-10-CM | POA: Diagnosis not present

## 2017-09-24 DIAGNOSIS — Z79899 Other long term (current) drug therapy: Secondary | ICD-10-CM | POA: Diagnosis not present

## 2017-09-24 DIAGNOSIS — F419 Anxiety disorder, unspecified: Secondary | ICD-10-CM | POA: Diagnosis not present

## 2017-09-24 DIAGNOSIS — K6389 Other specified diseases of intestine: Secondary | ICD-10-CM | POA: Diagnosis not present

## 2017-09-24 DIAGNOSIS — C20 Malignant neoplasm of rectum: Principal | ICD-10-CM | POA: Diagnosis present

## 2017-09-24 HISTORY — PX: FLEXIBLE SIGMOIDOSCOPY: SHX5431

## 2017-09-24 HISTORY — PX: LAPAROSCOPIC LOW ANTERIOR RESECTION: SHX5904

## 2017-09-24 LAB — TYPE AND SCREEN
ABO/RH(D): O POS
Antibody Screen: NEGATIVE

## 2017-09-24 SURGERY — RESECTION, RECTUM, LOW ANTERIOR, LAPAROSCOPIC
Anesthesia: General

## 2017-09-24 MED ORDER — CHLORHEXIDINE GLUCONATE CLOTH 2 % EX PADS: 6.0000 | MEDICATED_PAD | Freq: Once | CUTANEOUS | Status: DC

## 2017-09-24 MED ORDER — NEOMYCIN SULFATE 500 MG PO TABS: 1000.0000 mg | ORAL_TABLET | ORAL | Status: DC

## 2017-09-24 MED ORDER — LACTATED RINGERS IV SOLN
INTRAVENOUS | Status: DC
  Administered 2017-09-24 – 2017-09-26 (×3): via INTRAVENOUS

## 2017-09-24 MED ORDER — OXYCODONE HCL 5 MG PO TABS: 5.0000 mg | ORAL_TABLET | Freq: Once | ORAL | Status: DC | PRN

## 2017-09-24 MED ORDER — ALVIMOPAN 12 MG PO CAPS
12.0000 mg | ORAL_CAPSULE | Freq: Two times a day (BID) | ORAL | Status: DC
  Administered 2017-09-25 (×2): 12 mg via ORAL
  Filled 2017-09-24 (×3): qty 1

## 2017-09-24 MED ORDER — BUPIVACAINE LIPOSOME 1.3 % IJ SUSP
20.0000 mL | Freq: Once | INTRAMUSCULAR | Status: DC
  Filled 2017-09-24: qty 20

## 2017-09-24 MED ORDER — IBUPROFEN 200 MG PO TABS
600.0000 mg | ORAL_TABLET | Freq: Four times a day (QID) | ORAL | Status: DC | PRN
  Administered 2017-09-25: 600 mg via ORAL
  Filled 2017-09-24: qty 3

## 2017-09-24 MED ORDER — ROCURONIUM BROMIDE 10 MG/ML (PF) SYRINGE
PREFILLED_SYRINGE | INTRAVENOUS | Status: AC
  Filled 2017-09-24: qty 10

## 2017-09-24 MED ORDER — ONDANSETRON HCL 4 MG/2ML IJ SOLN
INTRAMUSCULAR | Status: DC | PRN
  Administered 2017-09-24: 4 mg via INTRAVENOUS

## 2017-09-24 MED ORDER — SACCHAROMYCES BOULARDII 250 MG PO CAPS
250.0000 mg | ORAL_CAPSULE | Freq: Two times a day (BID) | ORAL | Status: DC
  Administered 2017-09-24 – 2017-09-27 (×7): 250 mg via ORAL
  Filled 2017-09-24 (×7): qty 1

## 2017-09-24 MED ORDER — PROPOFOL 10 MG/ML IV BOLUS
INTRAVENOUS | Status: DC | PRN
  Administered 2017-09-24: 150 mg via INTRAVENOUS

## 2017-09-24 MED ORDER — LIDOCAINE 2% (20 MG/ML) 5 ML SYRINGE
INTRAMUSCULAR | Status: DC | PRN
  Administered 2017-09-24: 100 mg via INTRAVENOUS

## 2017-09-24 MED ORDER — ALUM & MAG HYDROXIDE-SIMETH 200-200-20 MG/5ML PO SUSP: 30.0000 mL | Freq: Four times a day (QID) | ORAL | Status: DC | PRN

## 2017-09-24 MED ORDER — FENTANYL CITRATE (PF) 100 MCG/2ML IJ SOLN
INTRAMUSCULAR | Status: AC
Start: 2017-09-24 — End: ?
  Filled 2017-09-24: qty 2

## 2017-09-24 MED ORDER — ACETAMINOPHEN 500 MG PO TABS
1000.0000 mg | ORAL_TABLET | ORAL | Status: AC
  Administered 2017-09-24: 1000 mg via ORAL
  Filled 2017-09-24: qty 2

## 2017-09-24 MED ORDER — SODIUM CHLORIDE 0.9 % IV SOLN
2.0000 g | INTRAVENOUS | Status: AC
  Administered 2017-09-24: 2 g via INTRAVENOUS
  Filled 2017-09-24: qty 2

## 2017-09-24 MED ORDER — BUPIVACAINE-EPINEPHRINE (PF) 0.25% -1:200000 IJ SOLN
INTRAMUSCULAR | Status: AC
  Filled 2017-09-24: qty 30

## 2017-09-24 MED ORDER — OXYCODONE HCL 5 MG/5ML PO SOLN: 5.0000 mg | Freq: Once | ORAL | Status: DC | PRN

## 2017-09-24 MED ORDER — SUGAMMADEX SODIUM 200 MG/2ML IV SOLN
INTRAVENOUS | Status: AC
  Filled 2017-09-24: qty 2

## 2017-09-24 MED ORDER — 0.9 % SODIUM CHLORIDE (POUR BTL) OPTIME
TOPICAL | Status: DC | PRN
  Administered 2017-09-24: 2000 mL

## 2017-09-24 MED ORDER — ALVIMOPAN 12 MG PO CAPS
12.0000 mg | ORAL_CAPSULE | ORAL | Status: AC
  Administered 2017-09-24: 12 mg via ORAL
  Filled 2017-09-24: qty 1

## 2017-09-24 MED ORDER — TRAMADOL HCL 50 MG PO TABS
50.0000 mg | ORAL_TABLET | Freq: Four times a day (QID) | ORAL | Status: DC | PRN
  Administered 2017-09-24 – 2017-09-27 (×6): 50 mg via ORAL
  Filled 2017-09-24 (×6): qty 1

## 2017-09-24 MED ORDER — LACTATED RINGERS IV SOLN
INTRAVENOUS | Status: DC
  Administered 2017-09-24 (×2): via INTRAVENOUS

## 2017-09-24 MED ORDER — ONDANSETRON HCL 4 MG PO TABS: 4.0000 mg | ORAL_TABLET | Freq: Four times a day (QID) | ORAL | Status: DC | PRN

## 2017-09-24 MED ORDER — MIDAZOLAM HCL 5 MG/5ML IJ SOLN
INTRAMUSCULAR | Status: DC | PRN
  Administered 2017-09-24: 2 mg via INTRAVENOUS

## 2017-09-24 MED ORDER — ALPRAZOLAM 1 MG PO TABS
1.0000 mg | ORAL_TABLET | Freq: Three times a day (TID) | ORAL | Status: DC | PRN
  Administered 2017-09-24 – 2017-09-27 (×6): 1 mg via ORAL
  Filled 2017-09-24 (×6): qty 1

## 2017-09-24 MED ORDER — HYDROMORPHONE HCL 1 MG/ML IJ SOLN
0.2500 mg | INTRAMUSCULAR | Status: DC | PRN
  Administered 2017-09-24 (×2): 0.5 mg via INTRAVENOUS

## 2017-09-24 MED ORDER — SCOPOLAMINE 1 MG/3DAYS TD PT72
MEDICATED_PATCH | TRANSDERMAL | Status: AC
  Filled 2017-09-24: qty 1

## 2017-09-24 MED ORDER — SCOPOLAMINE 1 MG/3DAYS TD PT72
1.0000 | MEDICATED_PATCH | TRANSDERMAL | Status: DC
  Administered 2017-09-24: 1.5 mg via TRANSDERMAL

## 2017-09-24 MED ORDER — KETAMINE HCL 10 MG/ML IJ SOLN
INTRAMUSCULAR | Status: AC
  Filled 2017-09-24: qty 1

## 2017-09-24 MED ORDER — ACETAMINOPHEN 500 MG PO TABS
1000.0000 mg | ORAL_TABLET | Freq: Four times a day (QID) | ORAL | Status: DC
  Administered 2017-09-24 – 2017-09-27 (×10): 1000 mg via ORAL
  Filled 2017-09-24 (×9): qty 2

## 2017-09-24 MED ORDER — GABAPENTIN 300 MG PO CAPS
300.0000 mg | ORAL_CAPSULE | ORAL | Status: AC
  Administered 2017-09-24: 300 mg via ORAL
  Filled 2017-09-24: qty 1

## 2017-09-24 MED ORDER — VORTIOXETINE HBR 5 MG PO TABS
10.0000 mg | ORAL_TABLET | Freq: Every day | ORAL | Status: DC
  Administered 2017-09-24 – 2017-09-26 (×3): 10 mg via ORAL
  Filled 2017-09-24 (×3): qty 2

## 2017-09-24 MED ORDER — LIDOCAINE HCL 2 % IJ SOLN
INTRAMUSCULAR | Status: AC
  Filled 2017-09-24: qty 20

## 2017-09-24 MED ORDER — LACTATED RINGERS IR SOLN
Status: DC | PRN
  Administered 2017-09-24: 1000 mL

## 2017-09-24 MED ORDER — KETAMINE HCL 10 MG/ML IJ SOLN
INTRAMUSCULAR | Status: DC | PRN
  Administered 2017-09-24: 20 mg via INTRAVENOUS
  Administered 2017-09-24 (×2): 10 mg via INTRAVENOUS

## 2017-09-24 MED ORDER — HYDROMORPHONE HCL 1 MG/ML IJ SOLN
0.5000 mg | INTRAMUSCULAR | Status: DC | PRN
  Administered 2017-09-25 – 2017-09-26 (×4): 0.5 mg via INTRAVENOUS
  Filled 2017-09-24 (×4): qty 0.5

## 2017-09-24 MED ORDER — FENTANYL CITRATE (PF) 100 MCG/2ML IJ SOLN
INTRAMUSCULAR | Status: AC
  Filled 2017-09-24: qty 2

## 2017-09-24 MED ORDER — SUGAMMADEX SODIUM 200 MG/2ML IV SOLN
INTRAVENOUS | Status: DC | PRN
  Administered 2017-09-24: 150 mg via INTRAVENOUS

## 2017-09-24 MED ORDER — DULOXETINE HCL 60 MG PO CPEP
60.0000 mg | ORAL_CAPSULE | Freq: Every day | ORAL | Status: DC
  Administered 2017-09-24 – 2017-09-26 (×3): 60 mg via ORAL
  Filled 2017-09-24 (×3): qty 1

## 2017-09-24 MED ORDER — HEPARIN SODIUM (PORCINE) 5000 UNIT/ML IJ SOLN
5000.0000 [IU] | Freq: Three times a day (TID) | INTRAMUSCULAR | Status: DC
  Administered 2017-09-24 – 2017-09-27 (×8): 5000 [IU] via SUBCUTANEOUS
  Filled 2017-09-24 (×8): qty 1

## 2017-09-24 MED ORDER — PROPOFOL 10 MG/ML IV BOLUS
INTRAVENOUS | Status: AC
  Filled 2017-09-24: qty 20

## 2017-09-24 MED ORDER — ENSURE SURGERY PO LIQD
237.0000 mL | Freq: Two times a day (BID) | ORAL | Status: DC
  Administered 2017-09-24 – 2017-09-26 (×5): 237 mL via ORAL
  Filled 2017-09-24 (×8): qty 237

## 2017-09-24 MED ORDER — HYDROMORPHONE HCL 1 MG/ML IJ SOLN
INTRAMUSCULAR | Status: AC
  Filled 2017-09-24: qty 1

## 2017-09-24 MED ORDER — LIDOCAINE 20MG/ML (2%) 15 ML SYRINGE OPTIME
INTRAMUSCULAR | Status: DC | PRN
  Administered 2017-09-24: 1.5 mg/kg/h via INTRAVENOUS

## 2017-09-24 MED ORDER — PROMETHAZINE HCL 25 MG/ML IJ SOLN: 6.2500 mg | INTRAMUSCULAR | Status: DC | PRN

## 2017-09-24 MED ORDER — BUPIVACAINE LIPOSOME 1.3 % IJ SUSP
INTRAMUSCULAR | Status: DC | PRN
  Administered 2017-09-24: 20 mL

## 2017-09-24 MED ORDER — HEPARIN SODIUM (PORCINE) 5000 UNIT/ML IJ SOLN
5000.0000 [IU] | Freq: Once | INTRAMUSCULAR | Status: AC
  Administered 2017-09-24: 5000 [IU] via SUBCUTANEOUS
  Filled 2017-09-24: qty 1

## 2017-09-24 MED ORDER — DEXAMETHASONE SODIUM PHOSPHATE 10 MG/ML IJ SOLN
INTRAMUSCULAR | Status: DC | PRN
  Administered 2017-09-24: 10 mg via INTRAVENOUS

## 2017-09-24 MED ORDER — LIDOCAINE 2% (20 MG/ML) 5 ML SYRINGE
INTRAMUSCULAR | Status: AC
  Filled 2017-09-24: qty 5

## 2017-09-24 MED ORDER — CELECOXIB 200 MG PO CAPS
200.0000 mg | ORAL_CAPSULE | ORAL | Status: AC
  Administered 2017-09-24: 200 mg via ORAL
  Filled 2017-09-24: qty 1

## 2017-09-24 MED ORDER — METRONIDAZOLE 500 MG PO TABS: 1000.0000 mg | ORAL_TABLET | ORAL | Status: DC

## 2017-09-24 MED ORDER — POLYETHYLENE GLYCOL 3350 17 GM/SCOOP PO POWD: 1.0000 | Freq: Once | ORAL | Status: DC

## 2017-09-24 MED ORDER — BUPIVACAINE-EPINEPHRINE 0.25% -1:200000 IJ SOLN
INTRAMUSCULAR | Status: DC | PRN
  Administered 2017-09-24: 30 mL

## 2017-09-24 MED ORDER — FENTANYL CITRATE (PF) 100 MCG/2ML IJ SOLN
INTRAMUSCULAR | Status: DC | PRN
  Administered 2017-09-24 (×4): 50 ug via INTRAVENOUS

## 2017-09-24 MED ORDER — HYDRALAZINE HCL 20 MG/ML IJ SOLN: 10.0000 mg | INTRAMUSCULAR | Status: DC | PRN

## 2017-09-24 MED ORDER — ROCURONIUM BROMIDE 10 MG/ML (PF) SYRINGE
PREFILLED_SYRINGE | INTRAVENOUS | Status: DC | PRN
  Administered 2017-09-24: 20 mg via INTRAVENOUS
  Administered 2017-09-24: 50 mg via INTRAVENOUS
  Administered 2017-09-24: 20 mg via INTRAVENOUS
  Administered 2017-09-24: 10 mg via INTRAVENOUS

## 2017-09-24 MED ORDER — ONDANSETRON HCL 4 MG/2ML IJ SOLN: 4.0000 mg | Freq: Four times a day (QID) | INTRAMUSCULAR | Status: DC | PRN

## 2017-09-24 MED ORDER — MIDAZOLAM HCL 2 MG/2ML IJ SOLN
INTRAMUSCULAR | Status: AC
  Filled 2017-09-24: qty 2

## 2017-09-24 SURGICAL SUPPLY — 83 items
ADH SKN CLS APL DERMABOND .7 (GAUZE/BANDAGES/DRESSINGS) ×1
APPLIER CLIP 5 13 M/L LIGAMAX5 (MISCELLANEOUS)
APPLIER CLIP ROT 10 11.4 M/L (STAPLE)
APR CLP MED LRG 11.4X10 (STAPLE)
APR CLP MED LRG 5 ANG JAW (MISCELLANEOUS)
BLADE EXTENDED COATED 6.5IN (ELECTRODE) IMPLANT
CABLE HIGH FREQUENCY MONO STRZ (ELECTRODE) ×2 IMPLANT
CATH MUSHROOM 28FR (CATHETERS) IMPLANT
CATH MUSHROOM 30FR (CATHETERS) IMPLANT
CELLS DAT CNTRL 66122 CELL SVR (MISCELLANEOUS) IMPLANT
CLIP APPLIE 5 13 M/L LIGAMAX5 (MISCELLANEOUS) IMPLANT
CLIP APPLIE ROT 10 11.4 M/L (STAPLE) IMPLANT
DECANTER SPIKE VIAL GLASS SM (MISCELLANEOUS) ×2 IMPLANT
DERMABOND ADVANCED (GAUZE/BANDAGES/DRESSINGS) ×1
DERMABOND ADVANCED .7 DNX12 (GAUZE/BANDAGES/DRESSINGS) IMPLANT
DISSECTOR BLUNT TIP ENDO 5MM (MISCELLANEOUS) IMPLANT
DRAIN CHANNEL 19F RND (DRAIN) IMPLANT
DRAPE SURG IRRIG POUCH 19X23 (DRAPES) ×2 IMPLANT
DRSG OPSITE POSTOP 4X10 (GAUZE/BANDAGES/DRESSINGS) IMPLANT
DRSG OPSITE POSTOP 4X6 (GAUZE/BANDAGES/DRESSINGS) IMPLANT
DRSG OPSITE POSTOP 4X8 (GAUZE/BANDAGES/DRESSINGS) ×1 IMPLANT
ELECT REM PT RETURN 15FT ADLT (MISCELLANEOUS) ×2 IMPLANT
EVACUATOR SILICONE 100CC (DRAIN) IMPLANT
GAUZE SPONGE 4X4 12PLY STRL (GAUZE/BANDAGES/DRESSINGS) IMPLANT
GLOVE BIO SURGEON STRL SZ7.5 (GLOVE) ×4 IMPLANT
GLOVE BIOGEL PI IND STRL 7.0 (GLOVE) IMPLANT
GLOVE BIOGEL PI IND STRL 7.5 (GLOVE) IMPLANT
GLOVE BIOGEL PI INDICATOR 7.0 (GLOVE) ×4
GLOVE BIOGEL PI INDICATOR 7.5 (GLOVE) ×4
GLOVE INDICATOR 8.0 STRL GRN (GLOVE) ×4 IMPLANT
GLOVE SURG SS PI 7.0 STRL IVOR (GLOVE) ×4 IMPLANT
GLOVE SURG SS PI 7.5 STRL IVOR (GLOVE) ×4 IMPLANT
GOWN L4 LG 24 PK N/S (GOWN DISPOSABLE) ×4 IMPLANT
GOWN STRL REUS W/TWL XL LVL3 (GOWN DISPOSABLE) ×8 IMPLANT
HANDLE STAPLE EGIA 4 XL (STAPLE) IMPLANT
HOLDER FOLEY CATH W/STRAP (MISCELLANEOUS) ×2 IMPLANT
LIGASURE IMPACT 36 18CM CVD LR (INSTRUMENTS) IMPLANT
PACK COLON (CUSTOM PROCEDURE TRAY) ×2 IMPLANT
PAD POSITIONING PINK XL (MISCELLANEOUS) ×2 IMPLANT
PORT LAP GEL ALEXIS MED 5-9CM (MISCELLANEOUS) IMPLANT
RELOAD STAPLE 60 4.1 GRN THCK (STAPLE) IMPLANT
RELOAD STAPLER GREEN 60MM (STAPLE) ×2 IMPLANT
RETRACTOR WND ALEXIS 18 MED (MISCELLANEOUS) IMPLANT
RTRCTR WOUND ALEXIS 18CM MED (MISCELLANEOUS)
SCISSORS LAP 5X35 DISP (ENDOMECHANICALS) ×2 IMPLANT
SEALER TISSUE G2 STRG ARTC 35C (ENDOMECHANICALS) ×2 IMPLANT
SET IRRIG TUBING LAPAROSCOPIC (IRRIGATION / IRRIGATOR) ×2 IMPLANT
SHEARS HARMONIC ACE PLUS 36CM (ENDOMECHANICALS) IMPLANT
SLEEVE ADV FIXATION 5X100MM (TROCAR) ×4 IMPLANT
SPONGE DRAIN TRACH 4X4 STRL 2S (GAUZE/BANDAGES/DRESSINGS) IMPLANT
SPONGE LAP 18X18 X RAY DECT (DISPOSABLE) ×1 IMPLANT
STAPLE ECHEON FLEX 60 POW ENDO (STAPLE) ×2 IMPLANT
STAPLER RELOAD GREEN 60MM (STAPLE) ×4
STAPLER TA28 THK CONTR EEA XL (STAPLE) ×1 IMPLANT
STAPLER VISISTAT 35W (STAPLE) IMPLANT
SUT ETHILON 3 0 PS 1 (SUTURE) IMPLANT
SUT MNCRL AB 4-0 PS2 18 (SUTURE) ×2 IMPLANT
SUT PDS AB 1 CTX 36 (SUTURE) ×2 IMPLANT
SUT PDS AB 1 TP1 96 (SUTURE) IMPLANT
SUT PROLENE 2 0 KS (SUTURE) ×2 IMPLANT
SUT PROLENE 2 0 SH DA (SUTURE) ×2 IMPLANT
SUT SILK 2 0 (SUTURE) ×2
SUT SILK 2 0 SH CR/8 (SUTURE) ×2 IMPLANT
SUT SILK 2-0 18XBRD TIE 12 (SUTURE) ×1 IMPLANT
SUT SILK 3 0 (SUTURE) ×2
SUT SILK 3 0 SH CR/8 (SUTURE) ×2 IMPLANT
SUT SILK 3-0 18XBRD TIE 12 (SUTURE) ×1 IMPLANT
SUT VIC AB 2-0 SH 27 (SUTURE) ×2
SUT VIC AB 2-0 SH 27X BRD (SUTURE) IMPLANT
SUT VIC AB 3-0 SH 18 (SUTURE) IMPLANT
SUT VICRYL 2 0 18  UND BR (SUTURE) ×1
SUT VICRYL 2 0 18 UND BR (SUTURE) ×1 IMPLANT
SYS LAPSCP GELPORT 120MM (MISCELLANEOUS)
SYSTEM LAPSCP GELPORT 120MM (MISCELLANEOUS) IMPLANT
TOWEL OR 17X26 10 PK STRL BLUE (TOWEL DISPOSABLE) IMPLANT
TOWEL OR NON WOVEN STRL DISP B (DISPOSABLE) ×2 IMPLANT
TRAY FOLEY MTR SLVR 14FR STAT (SET/KITS/TRAYS/PACK) ×1 IMPLANT
TROCAR ADV FIXATION 5X100MM (TROCAR) ×2 IMPLANT
TROCAR XCEL BLUNT TIP 100MML (ENDOMECHANICALS) ×2 IMPLANT
TUBING ENDO SMARTCAP (MISCELLANEOUS) ×1 IMPLANT
TUBING INSUF HEATED (TUBING) ×2 IMPLANT
VALVE SET DISP 3 PC AWS (VALVE) ×1 IMPLANT
YANKAUER SUCT BULB TIP 10FT TU (MISCELLANEOUS) ×1 IMPLANT

## 2017-09-24 NOTE — Op Note (Signed)
09/24/2017  12:22 PM  PATIENT:  [name]  57 y.o. female  Patient Care Team: Leighton Ruff, MD as PCP - General (Family Medicine)  PRE-OPERATIVE DIAGNOSIS:  Proximal RECTAL CANCER  POST-OPERATIVE DIAGNOSIS:  Same  PROCEDURE:   1. Laparoscopic low anterior resection with double stapled coloproctostomy 2. Bilateral TAP Blocks 3. Flexible sigmoidoscopy  SURGEON:  Sharon Mt. Jowanda Heeg, MD  ASSISTANT: Leighton Ruff, MD  ANESTHESIA:  general  COUNTS:  Sponge, needle and instrument counts were reported correct x2 at the conclusion of the operation.  EBL: 50cc  DRAINS: None  SPECIMEN: 1. Rectum and sigmoid colon as 1 unit 2. Distal donut - final distal margin  FINDINGS: Tattoo identified. Lesion was previously known somewhere between 11 and 15cm from anal verge - had been removed previously in piecemeal manner with hot snare. Partial proctectomy performed. Divided rectum below tattoo which corresponded to 8-9cm from anal verge. Specimen opened on backtable and mucosal scar identified which was the likely location of prior removal. Frozens sent and the scarred portion had fibrosis consistent with the prior polyp removal site. Margin was 2cm from staple line. Anvil/descending colon remained deep in pelvis without pulling back and anastomosis was tension free. Leak test intraoperatively demonstrated a pink and well perfused air tight, hemostatic anastomosis.   DISPOSITION: PACU in satisfactory condition  INDICATION: [name] is a very pleasant 41yoF with hx of diet controlled HLD presented to the office for evaluation of a newly diagnosed rectal cancer. She has never had a colonoscopy prior to the one in early June with Dr. Alessandra Bevels. This was performed for rectal bleeding which she had had for 6-7 months.  She reports that she does have some urinary urgency and occasionally incontinent episodes with coughing. She also reports occasional incontinence to gas. She has liquid  stool accidents 1-2 times per month but does not wear a pad or diaper and states this is not interfering with her quality of life or alter her daily activities. She denies any incontinence to solid stool.  Colonoscopy is significant for: -5 mm to adenoma removed from the sigmoid -30 mm polyp estimated to be 15 cm from the anus, multilobulated, semi-pedunculated, partially removed with hot snare in piecemeal fashion and tattooed. 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 PT 1 NX MX-IHC being performed separately.  She underwent staging CT chest abdomen and pelvis which demonstrated no evidence of metastatic disease  MRI Pelvis 08/22/17 showed a short segment circumferential wall thickening of the upper/mid rectum, likely corresponding to patient's suspected rectal cancer-T1 or T2, N0, distance from tumor to anal sphincter by direct measurement (not down the barrel of lumen per se) is 9 cm  Flex sig by myself 08/14/17 showed a flat/sessile area of polypoid mucosa on the third valve/rectosigmoid fold which was approximately 11 cm from the anal verge by flexible endoscopy and located on the third valve which also corresponded to a tight turn, presumably the sigmoid. The candidate lesion was on the backside of the third valve as well which would limit/preclude any attempts at TAMIS.  Options were discussed moving forward. She opted to pursue LAR with possible diverting loop ileostomy. Please refer to H&P for details regarding this discussion.  DESCRIPTION: The patient was identified in preop holding and taken to the OR#1 where she was placed on the operating room table and SCDs were placed. General endotracheal anesthesia was induced without difficulty.  A foley catheter was placed with sterile technique. The patient was then positioned in lithotomy and all pressure  points padded. The hair at all planned surgical sites was clipped as necessary. The patient was then prepped and draped in the standard sterile fashion. A surgical timeout was performed indicating the correct patient, procedure, positioning and need for preoperative antibiotics.   An infraumbilical incision was made and carried down through the subcutaneous tissues to the level of the infraumbilical fascia.  The umbilical stalk was grasped with a Kocher clamp and retracted outwardly.the infraumbilical fascia was incised.  The peritoneal cavity was gently entered bluntly.  A 0 Vicryl pursestring suture was placed.  The Hassan cannula was inserted and the perineal cavity and insufflation commenced to 15 mmHg with CO2.  Laparoscopic was then inserted into the peritoneal cavity and confirmed no evidence of trocar site complications.under laparoscopic visualization, bilateral TAP blocks were performed with dilute mixture of 0.25% marcaine with epinepherine and Exparel. 2 additional 5 mm trochars were then inserted into the right abdomen.  One additional 5 mm trocar was placed in the left abdomen.  The patient was then positioned in Trendelenburg with left side up.  The small bowel and omentum were reflected out of the pelvis. The sigmoid colon and rectum were identified.  The tattoo was readily visualized at the level of the peritoneal reflection.  A medial to lateral approach for mobilization was employed first.  The rectosigmoid colon was gently grasped and elevated.  The peritoneum overlying the rectosigmoid mesentery at the level of sick promontory was scored and then opened.  The avascular plane between the fascia propria of the rectum in the presacral fascia was entered. Working cephalad, the plane was continued.  The superior hemorrhoidal vessels as well as the inferior mesenteric artery are readily identified.  The left ureter wasn't identified within the retroperitoneum and swept "down" to the plane of  dissection. The superior hemorrhoidal pedicle and IMA were circumferentially dissected.  The location of the left ureter was again confirmed to be down and well away from the IMA pedicle. The nerve branches were seen overlying the aorta and protected. Just beyond these, at approximately 1cm from the origin of the IMA on the aorta, the pedicle was then ligated using the Enseal device. The pedicle was then observed and noted to be hemostatic.  Attention is then turned to rectal mobilization.  Working posteriorly first, the plane between the presacral fascia and fascia propria of the rectum was continued into the pelvis.  The tattoo was readily identified.  The plane of dissection continued past this level.  Following the posterior dissection, attention was turned to the lateral and finally anterior dissection planes.  The avascular TME plane was continued laterally and anteriorly.  Care was taken to stay well away from the pelvic sidewalls and retroperitoneal structures on both sides.  The uterus was retracted anteriorly during the anterior dissection and the vagina protected.  Once the rectum was fully mobilized and we were well below the tattoo, attention was turned to division of the rectum.  The sigmoid colon was first clamped and I went below to perform a flexible sigmoidoscopy.  The level of dissection had extended below the third valve of [place] and was at approximately 8 cm from the anal verge.  The mucosal scarring/candidate lesion site from prior snare removal was identified and above the planned area of division by at least 2 cm.i then changed and the clean gowns and gloves and went back above.  A laparoscopic echelon stapler with a green load was used to divide the rectum. This required 2 firings and we were below the tattoo which was noted to be distal to the lesion.  Attention was then turned to sigmoid/descending colon mobilization.  The sigmoid was long in the descending colon was quite mobile at  baseline.  The attachments of the sigmoid to the intersigmoid fossa were gently taken down.  The descending colon was then mobilized along the Zohar Maroney line of Toldt up to the proximal descending colon.  At this point, the descending colon easily reached into the pelvis and remained in that location without any tension.the pelvis was then reinspected and irrigated and noted to be hemostatic.  Attention was then turned to performing the colorectal anastomosis.  A 6 cm Pfannenstiel incision was made and deepened with electrocautery - at the location of her prior cesarean section.  The anterior sheath of the rectus fascia was identified and opened transversely.  Superior and inferior flaps were created between the anterior rectus sheath and the rectus muscle.  The midline was identified between the 2 rectus muscles and with established pneumoperitoneum, the peritoneum was opened.  An McGregor wound protector was placed.  The rectum and sigmoid colon was then delivered through this wound protector and the planned site of transection was identified.  The colon at this level was pink and well-perfused - having a branch of the left colic running right out to the planned site of transection.  A pursestring device was then applied and a 0 Prolene suture was passed through this device.  The colon just distal to the pursestring device was then transected and passed off as specimen with staple line being distal.  The pursestring device was removed and the lumen visualized.  Additional 3-0 silk sutures were placed to secure the pursestring to the colon.  EEA sizers were used and a 28 mm EEA anastomosis was planned based on sizers.  The anvil was placed within the lumen and the pursestring tied.  The anastomosis was prepared and no diverticula were noted to be within the planned staple line. A very small amount of fat was cleared.  At this point, the specimen was then opened on the back table. This was done anterior and on the  proximal rectum so as not to distort pathologic assessment of the specimen.  The distal rectum was then intussuscepted out of the anterior proctotomy. The site of mucosal scarring/dimpling was identified consistent with the prior piecemeal polypectomy was done with a hot snare by GI.  There was over 1 cm from the distal portion of the candidate lesion to the staple line.I then accompanied the specimen down to pathology for frozen analysis.  There is no abnormalities noted at the distal staple line.  The biopsy scar site was identified just at the level and above from the tattoo.  Frozen sections were performed and demonstrated fibrosis consistent with biopsy scar and at least on frozens no definitive adenocarcinoma.  I then scrubbed back in. The Alexis wound protector cap was then placed and pneumoperitoneum reestablished.  At this point the procedure, I went down to the perineum to pass the stapler with my assistant working from above.  A flex sig scope was passed into the anal canal and the pelvis was then filled with sterile saline.  There was no leak from the blind ending staple line. There was no mucosal abnormalities noted in the remnant rectum. The stump measured 8-9cm.  Inspection of the staple  line intraluminally revealed no evidence of other lesions nor tattoo.  The 28 mm lubricated EEA stapler was then passed and once abutting the staple line the spike was deployed just anterior to the staple line.  The colon and distal most aspect of rectum were then mated and the stapler closed, held and then fired.  The colon was occluded proximally and the pelvis was again filled with sterile saline.  The flexible sigmoidoscope was then inserted and inspection of the anastomosis demonstrated a pink, hemostatic, patent anastomosis and there was no air leak.  The irrigation was then evacuated. The anastomosis was 8-9cm from anal verge.  The abdomen was reinspected and hemostasis verified.  The trocars were then  removed under direct visualization as well as the [place] wound protector.   Gowns and gloves of all participating members of the operating team were then changed and the abdomen was redraped with sterile drapes and a separate closing set was obtained.  Attention was directed at closing the Pfannenstiel incision.  The peritoneum was closed with a running 2-0 Vicryl suture.  The fascia was then approximated using running #1 PDS sutures.  The skin of all incision sites was then closed with 4-0 Monocryl subcuticular sutures.  The incisions were covered with Dermabond.  The patient was then taken out of the lithotomy position, awakened, extubated, and transferred to a stretcher for transport to PACU in satisfactory condition.   Note: This dictation was prepared with Dragon/digital dictation along with [employer]. Any transcriptional errors that result from this process are unintentional.

## 2017-09-24 NOTE — Anesthesia Postprocedure Evaluation (Signed)
Anesthesia Post Note  Patient: [name]  Procedure(s) Performed: LAPAROSCOPIC LOW ANTERIOR RESECTION WITH COLOPROCTOSTOMY (N/A ) FLEXIBLE SIGMOIDOSCOPY (N/A )     Patient location during evaluation: PACU Anesthesia Type: General Level of consciousness: awake and alert Pain management: pain level controlled Vital Signs Assessment: post-procedure vital signs reviewed and stable Respiratory status: spontaneous breathing, nonlabored ventilation, respiratory function stable and patient connected to nasal cannula oxygen Cardiovascular status: blood pressure returned to baseline and stable Postop Assessment: no apparent nausea or vomiting Anesthetic complications: no    Last Vitals:  Vitals:   09/24/17 1425 09/24/17 1520  BP: (!) 127/104 131/80  Pulse: 64 61  Resp: 18 18  Temp: 37 C 37 C  SpO2: 100% 100%    Last Pain:  Vitals:   09/24/17 1520  TempSrc: Oral  PainSc:                  Amarianna Abplanalp P Grover [place]

## 2017-09-24 NOTE — H&P (Signed)
CC: Upper rectal cancer, here today for surgery  HPI: Ms. Salser is a very pleasant 20yoF with hx of diet controlled HLD presented to the office for evaluation of a newly diagnosed rectal cancer. She has never had a colonoscopy prior to the one in early June with Dr. Alessandra Bevels. This was performed for rectal bleeding which she had had for 6-7 months.  She reports that she does have some urinary urgency and occasionally incontinent episodes with coughing. She also reports occasional incontinence to gas. She has liquid stool accidents 1-2 times per month but does not wear a pad or diaper and states this is not interfering with her quality of life or alter her daily activities. She denies any incontinence to solid stool.  Colonoscopy is significant for: -5 mm to adenoma removed from the sigmoid -30 mm polyp estimated to be 15 cm from the anus, multilobulated, semi-pedunculated, partially removed with hot snare in piecemeal fashion and tattooed. 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 PT 1 NX MX-IHC being performed separately.  She underwent staging CT chest abdomen and pelvis which demonstrated no evidence of metastatic disease  MRI Pelvis 08/22/17 showed a short segment circumferential wall thickening of the upper/mid rectum, likely corresponding to patient's suspected rectal cancer-T1 or T2, N0, distance from tumor to anal sphincter by direct measurement (not down the barrel of lumen per se) is 9 cm  Flex sig by myself 08/14/17 showed a flat/sessile area of polypoid mucosa on the third valve/rectosigmoid fold which was approximately 11 cm from the anal verge by flexible endoscopy and located on the third valve which also corresponded to a tight turn, presumably the sigmoid. The candidate lesion was on the backside of the third valve as well which would  limit/preclude any attempts at TAMIS  CEA: Pending   OB/GYN: G5-6P2; she did have one C-section and one vaginal delivery. Her vaginal delivery was complicated by a "bad tear" that she states required repair of her anus as well.  PMH: HLD well controlled with diet  PSH: C-section 1 via Pfannenstiel; left thumb surgeries. Denies any prior abdominal surgeries aside from the C-section.  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 She works in Aeronautical engineer for [employer]: A comprehensive 10 system review of systems was completed with the patient and pertinent findings as noted above.   Past Medical History:  Diagnosis Date  . Anxiety   . Arthritis    RA  . Complication of anesthesia    hard time waking up  . Depression     Past Surgical History:  Procedure Laterality Date  . CARPOMETACARPEL SUSPENSION PLASTY Left 01/31/2017   Procedure: LEFT THUMB SUSPENSION ARTHROPLASTY AND METACARPOPHALANGEAL JOINT ARTHRODESIS;  Surgeon: Milly Jakob, MD;  Location: [facility];  Service: Orthopedics;  Laterality: Left;  . CESAREAN SECTION    . DILATION AND CURETTAGE OF UTERUS    . FLEXIBLE SIGMOIDOSCOPY N/A 08/14/2017   Procedure: FLEXIBLE SIGMOIDOSCOPY;  Surgeon: Ileana Roup, MD;  Location: [facility];  Service: General;  Laterality: N/A;  . FOOT SURGERY Right     Family History  Problem Relation Age of Onset  . Diabetes Mother   . Hypertension Mother   . Colon cancer Father   .  Breast cancer Paternal Grandmother     Social:  reports that she has been smoking.  She has been smoking about 1.00 pack per day. She has never used smokeless tobacco. She reports that she does not drink alcohol or use drugs.  Allergies: No Known Allergies  Medications: I have reviewed the patient's current  medications.  No results found for this or any previous visit (from the past 48 hour(s)).  No results found.  ROS - all of the below systems have been reviewed with the patient and positives are indicated with bold text General: chills, fever or night sweats Eyes: blurry vision or double vision ENT: epistaxis or sore throat Allergy/Immunology: itchy/watery eyes or nasal congestion Hematologic/Lymphatic: bleeding problems, blood clots or swollen lymph nodes Endocrine: temperature intolerance or unexpected weight changes Breast: new or changing breast lumps or nipple discharge Resp: cough, shortness of breath, or wheezing CV: chest pain or dyspnea on exertion GI: as per HPI GU: dysuria, trouble voiding, or hematuria MSK: joint pain or joint stiffness Neuro: TIA or stroke symptoms Derm: pruritus and skin lesion changes Psych: anxiety and depression  PE Blood pressure 123/74, pulse 82, temperature 98.2 F (36.8 C), temperature source Oral, resp. rate 18, SpO2 97 %. Constitutional: NAD; conversant; no deformities Eyes: Moist conjunctiva; no lid lag; anicteric; PERRL Neck: Trachea midline; no thyromegaly Lungs: Normal respiratory effort; no tactile fremitus CV: RRR; no palpable thrills; no pitting edema GI: Abd soft, NT/ND; no palpable hepatosplenomegaly MSK: Normal gait; no clubbing/cyanosis Psychiatric: Appropriate affect; alert and oriented x3 Lymphatic: No palpable cervical or axillary lymphadenopathy   A/P: Ms. Lacasse is a very pleasant 10yoF with hx of HLD - newly diagnosed rectal cancer - cT1/2 N0 M0 which is 11cm from anal verge by flexible endoscopy, on the 3rd valve extending into rectosigmoid as well.  -CEA 6.8 -Planning OR today for laparoscopic possible open low anterior resection, possible diverting loop ileostomy (based on distance of distal anastomosis from anal verge), flexible sigmoidoscopy -The planned procedure, material risks (including, but not limited to,  pain, bleeding, infection, scarring, need for blood transfusion, conversion to open operation, damage to surrounding structures- blood vessels/nerves/viscus/organs, damage to ureter, urine leak, leak from anastomosis, need for additional procedures, need for stoma which may be permanent, worsening of incontinence to the point of lifestyle/job limiting levels - particularly in her case given issues with gas and occasionally to liquid stool, hernia, DVT/PE, recurrence, pneumonia, heart attack, stroke, death) benefits and alternatives to surgery were discussed at length again today. The patient's questions were answered to her satisfaction, she voiced understanding and elected to proceed with surgery. Additionally, we discussed typical postoperative expectations and the recovery process.  Sharon Mt. Dema Severin, M.D. General and Colorectal Surgery [facility]

## 2017-09-24 NOTE — Transfer of Care (Signed)
Immediate Anesthesia Transfer of Care Note  Patient: [name]  Procedure(s) Performed: LAPAROSCOPIC LOW ANTERIOR RESECTION WITH COLOPROCTOSTOMY (N/A ) FLEXIBLE SIGMOIDOSCOPY (N/A )  Patient Location: PACU  Anesthesia Type:General  Level of Consciousness: sedated  Airway & Oxygen Therapy: Patient Spontanous Breathing and Patient connected to face mask oxygen  Post-op Assessment: Report given to RN and Post -op Vital signs reviewed and stable  Post vital signs: Reviewed and stable  Last Vitals:  Vitals Value Taken Time  BP 148/96 09/24/2017 12:35 PM  Temp    Pulse 80 09/24/2017 12:36 PM  Resp 17 09/24/2017 12:36 PM  SpO2 98 % 09/24/2017 12:36 PM  Vitals shown include unvalidated device data.  Last Pain:  Vitals:   09/24/17 0737  TempSrc:   PainSc: 0-No pain      Patients Stated Pain Goal: 3 (25/83/46 2194)  Complications: No apparent anesthesia complications

## 2017-09-24 NOTE — Anesthesia Procedure Notes (Signed)
Procedure Name: Intubation Date/Time: 09/24/2017 8:30 AM Performed by: Lind Covert, CRNA Pre-anesthesia Checklist: Patient identified, Emergency Drugs available, Suction available, Patient being monitored and Timeout performed Patient Re-evaluated:Patient Re-evaluated prior to induction Oxygen Delivery Method: Circle system utilized Preoxygenation: Pre-oxygenation with 100% oxygen Induction Type: IV induction Ventilation: Mask ventilation without difficulty Laryngoscope Size: Mac and 3 Tube type: Oral Tube size: 7.0 mm Number of attempts: 1 Airway Equipment and Method: Stylet Placement Confirmation: ETT inserted through vocal cords under direct vision,  positive ETCO2 and breath sounds checked- equal and bilateral Secured at: 21 cm Tube secured with: Tape Dental Injury: Teeth and Oropharynx as per pre-operative assessment

## 2017-09-25 ENCOUNTER — Encounter (HOSPITAL_COMMUNITY): Payer: Self-pay | Admitting: Surgery

## 2017-09-25 LAB — CBC
HCT: 32.4 % — ABNORMAL LOW (ref 36.0–46.0)
Hemoglobin: 10.8 g/dL — ABNORMAL LOW (ref 12.0–15.0)
MCH: 32.7 pg (ref 26.0–34.0)
MCHC: 33.3 g/dL (ref 30.0–36.0)
MCV: 98.2 fL (ref 78.0–100.0)
Platelets: 212 10*3/uL (ref 150–400)
RBC: 3.3 MIL/uL — ABNORMAL LOW (ref 3.87–5.11)
RDW: 14.1 % (ref 11.5–15.5)
WBC: 9.3 10*3/uL (ref 4.0–10.5)

## 2017-09-25 LAB — BASIC METABOLIC PANEL
Anion gap: 6 (ref 5–15)
BUN: 6 mg/dL (ref 6–20)
CO2: 28 mmol/L (ref 22–32)
Calcium: 8.6 mg/dL — ABNORMAL LOW (ref 8.9–10.3)
Chloride: 108 mmol/L (ref 98–111)
Creatinine, Ser: 0.66 mg/dL (ref 0.44–1.00)
GFR calc Af Amer: >60 mL/min (ref >60)
GFR calc non Af Amer: >60 mL/min (ref >60)
Glucose, Bld: 109 mg/dL — ABNORMAL HIGH (ref 70–99)
Potassium: 4.3 mmol/L (ref 3.5–5.1)
Sodium: 142 mmol/L (ref 135–145)

## 2017-09-25 LAB — PHOSPHORUS: Phosphorus: 3 mg/dL (ref 2.5–4.6)

## 2017-09-25 LAB — MAGNESIUM: Magnesium: 2.1 mg/dL (ref 1.7–2.4)

## 2017-09-25 NOTE — Progress Notes (Signed)
Subjective No acute events. Doing well - had some diarrhea immediately postop, none since. No flatus either. Denies n/v, decreased appetite. Ambulating. Pain well controlled  Objective: Vital signs in last 24 hours: Temp:  [97.4 F (36.3 C)-98.8 F (37.1 C)] 98.8 F (37.1 C) (08/01 0443) Pulse Rate:  [54-82] 54 (08/01 0443) Resp:  [16-24] 18 (08/01 0443) BP: (121-169)/(79-104) 136/85 (08/01 0443) SpO2:  [96 %-100 %] 98 % (08/01 0443) Weight:  [125 lb 14.1 oz (57.1 kg)] 125 lb 14.1 oz (57.1 kg) (08/01 0443) Last BM Date: 09/24/17  Intake/Output from previous day: 07/31 0701 - 08/01 0700 In: 3714.7 [P.O.:470; I.V.:3244.7] Out: 2750 [Urine:2700; Blood:50] Intake/Output this shift: No intake/output data recorded.  Gen: NAD, comfortable CV: RRR Pulm: Normal work of breathing Abd: Soft, NT/ND. Incisions c/d/i without erythema Ext: SCDs in place  Lab Results: CBC  Recent Labs    09/25/17 0427  WBC 9.3  HGB 10.8*  HCT 32.4*  PLT 212   BMET Recent Labs    09/25/17 0427  NA 142  K 4.3  CL 108  CO2 28  GLUCOSE 109*  BUN 6  CREATININE 0.66  CALCIUM 8.6*   PT/INR No results for input(s): LABPROT, INR in the last 72 hours. ABG No results for input(s): PHART, HCO3 in the last 72 hours.  Invalid input(s): PCO2, PO2  Studies/Results:  Anti-infectives: Anti-infectives (From admission, onward)   Start     Dose/Rate Route Frequency Ordered Stop   09/24/17 1400  neomycin (MYCIFRADIN) tablet 1,000 mg  Status:  Discontinued     1,000 mg Oral 3 times per day 09/24/17 0701 09/24/17 0708   09/24/17 1400  metroNIDAZOLE (FLAGYL) tablet 1,000 mg  Status:  Discontinued     1,000 mg Oral 3 times per day 09/24/17 0701 09/24/17 0708   09/24/17 0715  cefoTEtan (CEFOTAN) 2 g in sodium chloride 0.9 % 100 mL IVPB     2 g 200 mL/hr over 30 Minutes Intravenous On call to O.R. 09/24/17 0701 09/24/17 0833       Assessment/Plan: Patient Active Problem List   Diagnosis Date Noted   . Rectal cancer ([place]) 09/24/2017   s/p Procedure(s): LAPAROSCOPIC LOW ANTERIOR RESECTION WITH COLOPROCTOSTOMY FLEXIBLE SIGMOIDOSCOPY 09/24/2017  -Ambulate 5x/day -Full liquids; D/C IVF -Continue entereg until reliable bowel fxn - initially had bms right after surgery - likely 2/2 water prep in colon. No flatus/BM overnight -Keep foley today, plan to remove tomorrow -PPx: SQH, SCDs  LOS: 1 day   Sharon Mt. Dema Severin, M.D. General and Colorectal Surgery [facility]

## 2017-09-25 NOTE — Consult Note (Signed)
[place] Nurse ostomy consult note Was marked and counseled prior to surgery regarding possible ileostomy.  None was required.  Patient is relieved and feeling hopeful this morning. She is ambulating and feeling very good.  No further WOC needs at this time.  We will sign off.  Will not follow at this time.  Please re-consult if needed.  Domenic Moras RN BSN [place] Pager [phone]

## 2017-09-26 LAB — CBC
HCT: 33.8 % — ABNORMAL LOW (ref 36.0–46.0)
Hemoglobin: 11.1 g/dL — ABNORMAL LOW (ref 12.0–15.0)
MCH: 32.8 pg (ref 26.0–34.0)
MCHC: 32.8 g/dL (ref 30.0–36.0)
MCV: 100 fL (ref 78.0–100.0)
Platelets: 195 10*3/uL (ref 150–400)
RBC: 3.38 MIL/uL — ABNORMAL LOW (ref 3.87–5.11)
RDW: 14.3 % (ref 11.5–15.5)
WBC: 7.8 10*3/uL (ref 4.0–10.5)

## 2017-09-26 LAB — BASIC METABOLIC PANEL
Anion gap: 9 (ref 5–15)
BUN: 6 mg/dL (ref 6–20)
CO2: 30 mmol/L (ref 22–32)
Calcium: 8.8 mg/dL — ABNORMAL LOW (ref 8.9–10.3)
Chloride: 104 mmol/L (ref 98–111)
Creatinine, Ser: 0.62 mg/dL (ref 0.44–1.00)
GFR calc Af Amer: >60 mL/min (ref >60)
GFR calc non Af Amer: >60 mL/min (ref >60)
Glucose, Bld: 96 mg/dL (ref 70–99)
Potassium: 3.8 mmol/L (ref 3.5–5.1)
Sodium: 143 mmol/L (ref 135–145)

## 2017-09-26 LAB — PHOSPHORUS: Phosphorus: 2.7 mg/dL (ref 2.5–4.6)

## 2017-09-26 LAB — MAGNESIUM: Magnesium: 2 mg/dL (ref 1.7–2.4)

## 2017-09-26 MED ORDER — TRAMADOL HCL 50 MG PO TABS: 50.0000 mg | ORAL_TABLET | Freq: Four times a day (QID) | ORAL | 0 refills | Status: AC | PRN

## 2017-09-26 NOTE — Progress Notes (Signed)
Subjective No acute events. Doing well - having flatus and a smear of stool. To  Denies n/v. Ambulating. Pain well controlled. Voiding spontaneously without foley.  Objective: Vital signs in last 24 hours: Temp:  [98.5 F (36.9 C)-98.8 F (37.1 C)] 98.5 F (36.9 C) (08/02 0554) Pulse Rate:  [58-69] 58 (08/02 0554) Resp:  [18] 18 (08/02 0554) BP: (127-155)/(79-89) 143/89 (08/02 0554) SpO2:  [98 %-99 %] 99 % (08/02 0554) Weight:  [52.1 kg (114 lb 13.8 oz)] 52.1 kg (114 lb 13.8 oz) (08/02 0500) Last BM Date: 09/24/17  Intake/Output from previous day: 08/01 0701 - 08/02 0700 In: 2435.3 [P.O.:720; I.V.:1715.3] Out: 5900 [Urine:5900] Intake/Output this shift: No intake/output data recorded.  Gen: NAD, comfortable CV: RRR Pulm: Normal work of breathing Abd: Soft, NT/ND. Incisions c/d/i without erythema Ext: SCDs in place  Lab Results: CBC  Recent Labs    09/25/17 0427 09/26/17 0446  WBC 9.3 7.8  HGB 10.8* 11.1*  HCT 32.4* 33.8*  PLT 212 195   BMET Recent Labs    09/25/17 0427 09/26/17 0446  NA 142 143  K 4.3 3.8  CL 108 104  CO2 28 30  GLUCOSE 109* 96  BUN 6 6  CREATININE 0.66 0.62  CALCIUM 8.6* 8.8*   PT/INR No results for input(s): LABPROT, INR in the last 72 hours. ABG No results for input(s): PHART, HCO3 in the last 72 hours.  Invalid input(s): PCO2, PO2  Studies/Results:  Anti-infectives: Anti-infectives (From admission, onward)   Start     Dose/Rate Route Frequency Ordered Stop   09/24/17 1400  neomycin (MYCIFRADIN) tablet 1,000 mg  Status:  Discontinued     1,000 mg Oral 3 times per day 09/24/17 0701 09/24/17 0708   09/24/17 1400  metroNIDAZOLE (FLAGYL) tablet 1,000 mg  Status:  Discontinued     1,000 mg Oral 3 times per day 09/24/17 0701 09/24/17 0708   09/24/17 0715  cefoTEtan (CEFOTAN) 2 g in sodium chloride 0.9 % 100 mL IVPB     2 g 200 mL/hr over 30 Minutes Intravenous On call to O.R. 09/24/17 0701 09/24/17 0833        Assessment/Plan: Patient Active Problem List   Diagnosis Date Noted  . Rectal cancer ([place]) 09/24/2017   s/p Procedure(s): LAPAROSCOPIC LOW ANTERIOR RESECTION WITH COLOPROCTOSTOMY FLEXIBLE SIGMOIDOSCOPY 09/24/2017  -Ambulate 5x/day -GI soft diet -Continue entereg until reliable bowel fxn - initially had bms right after surgery - likely 2/2 water prep in colon. -PPx: SQH, SCDs -Dispo: Possible discharge tomorrow if having reliable bowel fxn, tolerating diet, pain controlled   LOS: 2 days   Sharon Mt. Dema Severin, M.D. General and Colorectal Surgery [facility]

## 2017-09-26 NOTE — Discharge Instructions (Signed)
POST OP INSTRUCTIONS  1. DIET: As tolerated.Avoid raw uncooked fruits and vegetables until 4 weeks out from surgery.  Be sure to include lots of fluids daily.  Avoid fast food or heavy meals as your are more likely to get nauseated.  2. Take your usually prescribed home medications unless otherwise directed.  3. PAIN CONTROL: a. Pain is best controlled by a usual combination of three different methods TOGETHER: i. Ice/Heat ii. Over the counter pain medication iii. Prescription pain medication b. Most patients will experience some swelling and bruising around the surgical site.  Ice packs or heating pads (30-60 minutes up to 6 times a day) will help. Some people prefer to use ice alone, heat alone, alternating between ice & heat.  Experiment to what works for you.  Swelling and bruising can take several weeks to resolve.   c. It is helpful to take an over-the-counter pain medication regularly for the first few weeks: i. Ibuprofen (Motrin/Advil) - 200mg  tabs - take 3 tabs (600mg ) every 6 hours as needed for pain ii. Acetaminophen (Tylenol) - you may take 650mg  every 6 hours as needed. You can take this with motrin as they act differently on the body. If you are taking a narcotic pain medication that has acetaminophen in it, do not take over the counter tylenol at the same time.  Iii. NOTE: You may take both of these medications together - most patients  find it most helpful when alternating between the two (i.e. Ibuprofen at 6am,  tylenol at 9am, ibuprofen at 12pm ...) d. A  prescription for pain medication should be given to you upon discharge.  Take your pain medication as prescribed if your pain is not adequatly controlled with the over-the-counter pain reliefs mentioned above.  4. Avoid getting constipated.  Between the surgery and the pain medications, it is common to experience some constipation.  Increasing fluid intake and taking a fiber supplement (such as Metamucil, Citrucel, FiberCon,  MiraLax, etc) 1-2 times a day regularly will usually help prevent this problem from occurring.  A mild laxative (prune juice, Milk of Magnesia, MiraLax, etc) should be taken according to package directions if there are no bowel movements after 48 hours.    5. Dressing: Your incisions are covered in Dermabond which is like sterile superglue for the skin. This will come off on it's own in a couple weeks. It is waterproof and you may bathe normally in a shower getting soap/water over the wounds. Avoid baths/pools/lakes/oceans until your wounds have fully healed.  6. ACTIVITIES as tolerated:   a. Avoid heavy lifting (>10lbs or 1 gallon of milk) for the next 6 weeks. b. You may resume regular (light) daily activities beginning the next day--such as daily self-care, walking, climbing stairs--gradually increasing activities as tolerated.  If you can walk 30 minutes without difficulty, it is safe to try more intense activity such as jogging, treadmill, bicycling, low-impact aerobics.  c. DO NOT PUSH THROUGH PAIN.  Let pain be your guide: If it hurts to do something, don't do it. d. Dennis Bast may drive when you are no longer taking prescription pain medication, you can comfortably wear a seatbelt, and you can safely maneuver your car and apply brakes.  7. FOLLOW UP in our office a. Please call [facility] at [phone] to set up an appointment to see your surgeon in the office for a follow-up appointment approximately 2 weeks after your surgery. b. Make sure that you call for this appointment the day you arrive  home to insure a convenient appointment time.  9. If you have disability or family leave forms that need to be completed, you may have them completed by your primary care physician's office; for return to work instructions, please ask our office staff and they will be happy to assist you in obtaining this documentation   When to call us [phone]: 1. Poor pain control 2. Reactions / problems with new  medications (rash/itching, etc)  3. Fever over 101.5 F (38.5 C) 4. Inability to urinate 5. Nausea/vomiting 6. Worsening swelling or bruising 7. Continued bleeding from incision. 8. Increased pain, redness, or drainage from the incision  The clinic staff is available to answer your questions during regular business hours (8:30am-5pm).  Please dont hesitate to call and ask to speak to one of our nurses for clinical concerns.   A surgeon from [facility] is always on call at the hospitals   If you have a medical emergency, go to the nearest emergency room or call 911.  [facility], [place] [address] [place], [place], [place]  [address] MAIN: [phone] FAX: [phone] [email or web address]

## 2017-09-27 LAB — CBC
HCT: 35.4 % — ABNORMAL LOW (ref 36.0–46.0)
Hemoglobin: 11.9 g/dL — ABNORMAL LOW (ref 12.0–15.0)
MCH: 32.8 pg (ref 26.0–34.0)
MCHC: 33.6 g/dL (ref 30.0–36.0)
MCV: 97.5 fL (ref 78.0–100.0)
Platelets: 221 10*3/uL (ref 150–400)
RBC: 3.63 MIL/uL — ABNORMAL LOW (ref 3.87–5.11)
RDW: 14.2 % (ref 11.5–15.5)
WBC: 7.9 10*3/uL (ref 4.0–10.5)

## 2017-09-27 LAB — BASIC METABOLIC PANEL
Anion gap: 7 (ref 5–15)
BUN: 10 mg/dL (ref 6–20)
CO2: 30 mmol/L (ref 22–32)
Calcium: 9.4 mg/dL (ref 8.9–10.3)
Chloride: 104 mmol/L (ref 98–111)
Creatinine, Ser: 0.68 mg/dL (ref 0.44–1.00)
GFR calc Af Amer: >60 mL/min (ref >60)
GFR calc non Af Amer: >60 mL/min (ref >60)
Glucose, Bld: 103 mg/dL — ABNORMAL HIGH (ref 70–99)
Potassium: 3.8 mmol/L (ref 3.5–5.1)
Sodium: 141 mmol/L (ref 135–145)

## 2017-09-27 LAB — PHOSPHORUS: Phosphorus: 4 mg/dL (ref 2.5–4.6)

## 2017-09-27 LAB — MAGNESIUM: Magnesium: 2.1 mg/dL (ref 1.7–2.4)

## 2017-09-27 NOTE — Discharge Summary (Signed)
Physician Discharge Summary  Patient ID: DESA RECH MRN: [id number] DOB/AGE: 10/17/1960 57 y.o.  Admit date: 09/24/2017 Discharge date: 09/27/2017  Admission Diagnoses: Rectal cancer  Discharge Diagnoses:  Active Problems:   Rectal cancer [facility])   Discharged Condition: good  Hospital Course: Ptadmitted after surgery.  Her diet was advanced as tolerated.  By pod 3, she was tolerating a diet, her pain was controlled with PO meds, she was having bowel function and ambulating and urinating without difficulty.    Consults: None  Significant Diagnostic Studies: labs: cbc, bmet  Treatments: IV hydration, analgesia: acetaminophen and surgery: lap LAR  Discharge Exam: Blood pressure (!) 162/91, pulse (!) 55, temperature 98.7 F (37.1 C), temperature source Oral, resp. rate 16, height 5\' 2"  (1.575 m), weight 53.1 kg (117 lb 1 oz), SpO2 98 %. General appearance: alert and cooperative GI: normal findings: soft, non-tender Incision/Wound: C/D/I  Disposition:    Allergies as of 09/27/2017   No Known Allergies     Medication List    STOP taking these medications   methotrexate 2.5 MG tablet Commonly known as:  RHEUMATREX     TAKE these medications   alendronate 70 MG tablet Commonly known as:  FOSAMAX Take 70 mg by mouth once a week. On Mondays   ALPRAZolam 1 MG tablet Commonly known as:  XANAX Take 1 mg by mouth 3 (three) times daily as needed for anxiety.   DULoxetine 60 MG capsule Commonly known as:  CYMBALTA Take 60 mg by mouth at bedtime.   ergocalciferol 50000 units capsule Commonly known as:  VITAMIN D2 Take 50,000 Units by mouth every Thursday at 6pm.   folic acid 1 MG tablet Commonly known as:  FOLVITE Take 1 mg by mouth at bedtime.   naproxen sodium 220 MG tablet Commonly known as:  ALEVE Take 440 mg by mouth 2 (two) times daily as needed (for headaches/arthritis.).   traMADol 50 MG tablet Commonly known as:  ULTRAM Take 1 tablet (50 mg total) by mouth  every 6 (six) hours as needed for up to 7 days (postop pain not controlled with tylenol and ibuprofen).   TRINTELLIX 10 MG Tabs tablet Generic drug:  vortioxetine HBr Take 10 mg by mouth at bedtime.      Follow-up Information    Ileana Roup, MD Follow up in 2 week(s).   Specialty:  General Surgery Contact information: [place] [address] [phone]           Signed: Rosario Adie 11/02/9478, 1:65 AM

## 2017-10-14 ENCOUNTER — Telehealth: Payer: Self-pay | Admitting: Genetics

## 2017-10-14 NOTE — Telephone Encounter (Signed)
Patient called to cancel °

## 2017-10-16 ENCOUNTER — Inpatient Hospital Stay: Payer: BLUE CROSS/BLUE SHIELD

## 2017-10-16 ENCOUNTER — Inpatient Hospital Stay: Payer: BLUE CROSS/BLUE SHIELD | Admitting: Genetics

## 2017-10-21 DIAGNOSIS — M25341 Other instability, right hand: Secondary | ICD-10-CM | POA: Diagnosis not present

## 2017-10-21 DIAGNOSIS — F3342 Major depressive disorder, recurrent, in full remission: Secondary | ICD-10-CM | POA: Diagnosis not present

## 2017-10-21 DIAGNOSIS — F410 Panic disorder [episodic paroxysmal anxiety] without agoraphobia: Secondary | ICD-10-CM | POA: Diagnosis not present

## 2017-11-21 DIAGNOSIS — S66320A Laceration of extensor muscle, fascia and tendon of right index finger at wrist and hand level, initial encounter: Secondary | ICD-10-CM | POA: Diagnosis not present

## 2017-11-21 DIAGNOSIS — M180 Bilateral primary osteoarthritis of first carpometacarpal joints: Secondary | ICD-10-CM | POA: Diagnosis not present

## 2017-11-21 DIAGNOSIS — M79644 Pain in right finger(s): Secondary | ICD-10-CM | POA: Diagnosis not present

## 2017-11-21 DIAGNOSIS — M13141 Monoarthritis, not elsewhere classified, right hand: Secondary | ICD-10-CM | POA: Diagnosis not present

## 2017-11-21 DIAGNOSIS — G8918 Other acute postprocedural pain: Secondary | ICD-10-CM | POA: Diagnosis not present

## 2017-11-21 DIAGNOSIS — M182 Bilateral post-traumatic osteoarthritis of first carpometacarpal joints: Secondary | ICD-10-CM | POA: Diagnosis not present

## 2017-12-03 DIAGNOSIS — M79641 Pain in right hand: Secondary | ICD-10-CM | POA: Diagnosis not present

## 2017-12-03 DIAGNOSIS — M1811 Unilateral primary osteoarthritis of first carpometacarpal joint, right hand: Secondary | ICD-10-CM | POA: Diagnosis not present

## 2017-12-09 DIAGNOSIS — M1811 Unilateral primary osteoarthritis of first carpometacarpal joint, right hand: Secondary | ICD-10-CM | POA: Diagnosis not present

## 2017-12-09 DIAGNOSIS — M79641 Pain in right hand: Secondary | ICD-10-CM | POA: Diagnosis not present

## 2017-12-16 DIAGNOSIS — M79641 Pain in right hand: Secondary | ICD-10-CM | POA: Diagnosis not present

## 2017-12-16 DIAGNOSIS — M1811 Unilateral primary osteoarthritis of first carpometacarpal joint, right hand: Secondary | ICD-10-CM | POA: Diagnosis not present

## 2017-12-26 DIAGNOSIS — M79641 Pain in right hand: Secondary | ICD-10-CM | POA: Diagnosis not present

## 2017-12-26 DIAGNOSIS — M1811 Unilateral primary osteoarthritis of first carpometacarpal joint, right hand: Secondary | ICD-10-CM | POA: Diagnosis not present

## 2017-12-30 DIAGNOSIS — M79641 Pain in right hand: Secondary | ICD-10-CM | POA: Diagnosis not present

## 2017-12-30 DIAGNOSIS — M1811 Unilateral primary osteoarthritis of first carpometacarpal joint, right hand: Secondary | ICD-10-CM | POA: Diagnosis not present

## 2018-01-06 DIAGNOSIS — M1811 Unilateral primary osteoarthritis of first carpometacarpal joint, right hand: Secondary | ICD-10-CM | POA: Diagnosis not present

## 2018-01-08 DIAGNOSIS — M1811 Unilateral primary osteoarthritis of first carpometacarpal joint, right hand: Secondary | ICD-10-CM | POA: Diagnosis not present

## 2018-01-08 DIAGNOSIS — M79641 Pain in right hand: Secondary | ICD-10-CM | POA: Diagnosis not present

## 2018-01-13 DIAGNOSIS — M1811 Unilateral primary osteoarthritis of first carpometacarpal joint, right hand: Secondary | ICD-10-CM | POA: Diagnosis not present

## 2018-01-13 DIAGNOSIS — M79641 Pain in right hand: Secondary | ICD-10-CM | POA: Diagnosis not present

## 2018-01-20 DIAGNOSIS — M79641 Pain in right hand: Secondary | ICD-10-CM | POA: Diagnosis not present

## 2018-01-20 DIAGNOSIS — M1811 Unilateral primary osteoarthritis of first carpometacarpal joint, right hand: Secondary | ICD-10-CM | POA: Diagnosis not present

## 2018-02-23 DIAGNOSIS — M25341 Other instability, right hand: Secondary | ICD-10-CM | POA: Diagnosis not present

## 2018-02-23 DIAGNOSIS — M1811 Unilateral primary osteoarthritis of first carpometacarpal joint, right hand: Secondary | ICD-10-CM | POA: Diagnosis not present

## 2018-04-02 DIAGNOSIS — C20 Malignant neoplasm of rectum: Secondary | ICD-10-CM | POA: Diagnosis not present

## 2018-04-03 ENCOUNTER — Other Ambulatory Visit: Payer: Self-pay | Admitting: Surgery

## 2018-04-03 DIAGNOSIS — C20 Malignant neoplasm of rectum: Secondary | ICD-10-CM

## 2018-04-09 ENCOUNTER — Ambulatory Visit
Admission: RE | Admit: 2018-04-09 | Discharge: 2018-04-09 | Disposition: A | Discharge status: Home / Self Care | Payer: BLUE CROSS/BLUE SHIELD | Source: Ambulatory Visit | Attending: Surgery | Admitting: Surgery

## 2018-04-09 DIAGNOSIS — R911 Solitary pulmonary nodule: Secondary | ICD-10-CM | POA: Diagnosis not present

## 2018-04-09 DIAGNOSIS — C20 Malignant neoplasm of rectum: Secondary | ICD-10-CM

## 2018-04-09 DIAGNOSIS — R634 Abnormal weight loss: Secondary | ICD-10-CM | POA: Diagnosis not present

## 2018-04-09 MED ORDER — IOPAMIDOL (ISOVUE-300) INJECTION 61%
100.0000 mL | Freq: Once | INTRAVENOUS | Status: AC | PRN
  Administered 2018-04-09: 100 mL via INTRAVENOUS

## 2018-04-23 DIAGNOSIS — F3342 Major depressive disorder, recurrent, in full remission: Secondary | ICD-10-CM | POA: Diagnosis not present

## 2018-04-23 DIAGNOSIS — F410 Panic disorder [episodic paroxysmal anxiety] without agoraphobia: Secondary | ICD-10-CM | POA: Diagnosis not present

## 2018-05-06 DIAGNOSIS — H40013 Open angle with borderline findings, low risk, bilateral: Secondary | ICD-10-CM | POA: Diagnosis not present

## 2018-08-06 DIAGNOSIS — R194 Change in bowel habit: Secondary | ICD-10-CM | POA: Diagnosis not present

## 2018-08-06 DIAGNOSIS — C189 Malignant neoplasm of colon, unspecified: Secondary | ICD-10-CM | POA: Diagnosis not present

## 2018-08-06 DIAGNOSIS — R634 Abnormal weight loss: Secondary | ICD-10-CM | POA: Diagnosis not present

## 2018-08-14 DIAGNOSIS — C189 Malignant neoplasm of colon, unspecified: Secondary | ICD-10-CM | POA: Diagnosis not present

## 2018-10-15 DIAGNOSIS — F3342 Major depressive disorder, recurrent, in full remission: Secondary | ICD-10-CM | POA: Diagnosis not present

## 2018-10-15 DIAGNOSIS — F410 Panic disorder [episodic paroxysmal anxiety] without agoraphobia: Secondary | ICD-10-CM | POA: Diagnosis not present

## 2018-11-03 DIAGNOSIS — Z85038 Personal history of other malignant neoplasm of large intestine: Secondary | ICD-10-CM | POA: Diagnosis not present

## 2018-11-03 DIAGNOSIS — K5730 Diverticulosis of large intestine without perforation or abscess without bleeding: Secondary | ICD-10-CM | POA: Diagnosis not present

## 2018-11-03 DIAGNOSIS — Z980 Intestinal bypass and anastomosis status: Secondary | ICD-10-CM | POA: Diagnosis not present

## 2018-11-03 DIAGNOSIS — K589 Irritable bowel syndrome without diarrhea: Secondary | ICD-10-CM | POA: Diagnosis not present

## 2018-11-10 DIAGNOSIS — M069 Rheumatoid arthritis, unspecified: Secondary | ICD-10-CM | POA: Diagnosis not present

## 2018-11-10 DIAGNOSIS — R2232 Localized swelling, mass and lump, left upper limb: Secondary | ICD-10-CM | POA: Diagnosis not present

## 2018-11-10 DIAGNOSIS — M6740 Ganglion, unspecified site: Secondary | ICD-10-CM | POA: Diagnosis not present

## 2018-11-10 DIAGNOSIS — Z23 Encounter for immunization: Secondary | ICD-10-CM | POA: Diagnosis not present

## 2018-11-12 ENCOUNTER — Telehealth: Payer: Self-pay | Admitting: Oncology

## 2018-11-12 ENCOUNTER — Encounter: Payer: Self-pay | Admitting: Oncology

## 2018-11-12 NOTE — Telephone Encounter (Signed)
Received a new patient referral from Dr. Alessandra Bevels for elevated CEA and hx of colon cancer. [name] has been scheduled to see Dr. Benay Spice on 9/29 at 2pm. I was unable to reach [name]. Letter mailed.

## 2018-11-13 ENCOUNTER — Telehealth: Payer: Self-pay | Admitting: Oncology

## 2018-11-13 NOTE — Telephone Encounter (Signed)
Pt cld and confirmed appt w/Dr. Benay Spice on 9/29 at 2pm

## 2018-11-24 ENCOUNTER — Other Ambulatory Visit: Payer: Self-pay

## 2018-11-24 ENCOUNTER — Inpatient Hospital Stay: Payer: BC Managed Care – PPO | Attending: Oncology | Admitting: Oncology

## 2018-11-24 VITALS — BP 136/86 | HR 78 | Temp 98.3°F | Resp 16 | Ht 62.0 in | Wt 106.5 lb

## 2018-11-24 DIAGNOSIS — Z85048 Personal history of other malignant neoplasm of rectum, rectosigmoid junction, and anus: Secondary | ICD-10-CM | POA: Diagnosis not present

## 2018-11-24 DIAGNOSIS — C20 Malignant neoplasm of rectum: Secondary | ICD-10-CM

## 2018-11-24 NOTE — Progress Notes (Signed)
[facility] New Patient Consult   Requesting FX:TKWIO Brahmbatt  RIA REDCAY 58 y.o.  February 24, 1961    Reason for Consult: Rectal cancer   HPI: Mr.Okerlund had a screening colonoscopy 08/01/2017.  A polyp was removed from the proximal sigmoid colon.  A 30 mm lobulated polyp was found at 15 cm proximal to the anus.  The polyp was removed in a piecemeal technique.  The polyp resection was incomplete.  The area was tattooed.  The pathology revealed a tubular adenoma of the sigmoid colon polyp.  Turned as invasive well-differentiated adenocarcinoma in a traditional serrated adenoma with high rate dysplasia.  The invasive tumor measured 6 mm and extended into submucosa with diffuse involvement of the cauterized tissue edges.  Tumor budding score 1. She was referred to Dr. Dema Severin a flexible sigmoidoscopy on 08/14/2017.  A tattoo was seen in the proximal rectum.  A flat area of polypoid mucosa was found on the third valve at 11 cm from the anal verge.  CTs on 08/07/2017 revealed no colonic mass.  No lymphadenopathy.  No evidence of metastatic disease.  A pelvic MRI on 08/22/2016 revealed evidence of a tumor at 9 cm from the anal sphincter.  The tumor was staged as a T1 or T2, N0 lesion.  She was taken to a laparoscopic low anterior resection by Dr. Dema Severin on 09/24/2017.  The pathology [id number]) revealed a previous polypectomy site, negative for residual dysplasia or malignancy.  The resection margins are negative.  16 lymph nodes were negative for metastatic carcinoma.  She reports feeling well.  CTs on 04/09/2018 revealed no evidence of local recurrence or distant metastatic disease.   She saw Dr. Alessandra Bevels this summer and underwent a surveillance colonoscopy CEA on 08/14/2018 returned at 6.3.  She is referred for oncology evaluation.  Past Medical History:  Diagnosis Date  . Anxiety   . Arthritis    RA  . Complication of anesthesia    hard time waking up  . Depression    .  Rectal  cancer, T1No, June 2018   .  G8, P2, 6 miscarriages Past Surgical History:  Procedure Laterality Date  . CARPOMETACARPEL SUSPENSION PLASTY Left 01/31/2017   Procedure: LEFT THUMB SUSPENSION ARTHROPLASTY AND METACARPOPHALANGEAL JOINT ARTHRODESIS;  Surgeon: Milly Jakob, MD;  Location: [facility];  Service: Orthopedics;  Laterality: Left;  . CESAREAN SECTION    . DILATION AND CURETTAGE OF UTERUS    . FLEXIBLE SIGMOIDOSCOPY N/A 08/14/2017   Procedure: FLEXIBLE SIGMOIDOSCOPY;  Surgeon: Ileana Roup, MD;  Location: [facility];  Service: General;  Laterality: N/A;  . FLEXIBLE SIGMOIDOSCOPY N/A 09/24/2017   Procedure: Beryle Quant;  Surgeon: Ileana Roup, MD;  Location: [facility];  Service: General;  Laterality: N/A;  . FOOT SURGERY Right   . LAPAROSCOPIC LOW ANTERIOR RESECTION N/A 09/24/2017   Procedure: LAPAROSCOPIC LOW ANTERIOR RESECTION WITH COLOPROCTOSTOMY;  Surgeon: Ileana Roup, MD;  Location: [facility];  Service: General;  Laterality: N/A;    Medications: Reviewed  Allergies: No Known Allergies  Family history: Father had rectal cancer.  Her brother had prostate cancer.  Social History:   She lives with her husband and Gordon.  She works in Aeronautical engineer.  She is a recovering alcoholic for 12 years.  She has smoked 1 pack of cigarettes per day for 35-40 years.  No transfusion history.  No risk factor for HIV or hepatitis.  ROS:   Positives include: Arthritis pain in the neck and hands, irregular bowel  habits following the rectal surgery, anorexia Rectal surgery-weight stable  A complete ROS was otherwise negative.  Physical Exam:  Blood pressure 136/86, pulse 78, temperature 98.3 F (36.8 C), temperature source Oral, resp. rate 16, height '5\' 2"'$  (1.575 m), weight 106 lb 8 oz (48.3 kg), SpO2 98 %.  HEENT: Neck without mass Lungs: Clear bilaterally Cardiac: Regular rate and rhythm Abdomen: No mass, nontender, no hepatosplenomegaly  Vascular:  No leg edema Lymph nodes: Cervical or supraclavicular nodes.  "Shotty "bilateral axillary and inguinal nodes Neurologic: Alert and oriented, motor exam appears intact in the upper and lower extremities bilaterally Musculoskeletal: No spine tenderness   LAB:  CBC  Lab Results  Component Value Date   WBC 7.9 09/27/2017   HGB 11.9 (L) 09/27/2017   HCT 35.4 (L) 09/27/2017   MCV 97.5 09/27/2017   PLT 221 09/27/2017   NEUTROABS 4.4 09/19/2017        CMP  Lab Results  Component Value Date   NA 141 09/27/2017   K 3.8 09/27/2017   CL 104 09/27/2017   CO2 30 09/27/2017   GLUCOSE 103 (H) 09/27/2017   BUN 10 09/27/2017   CREATININE 0.68 09/27/2017   CALCIUM 9.4 09/27/2017   PROT 7.9 09/19/2017   ALBUMIN 5.0 09/19/2017   AST 26 09/19/2017   ALT 21 09/19/2017   ALKPHOS 54 09/19/2017   BILITOT 1.0 09/19/2017   GFRNONAA >60 09/27/2017   GFRAA >60 09/27/2017     Lab Results  Component Value Date   CEA1 6.8 (H) 09/19/2017  CEA on 08/14/2018: 6.3    Assessment/Plan:   1. Rectal cancer, stage I (T1N0), status post a low anterior resection 09/24/2017  Colonoscopy 08/01/2017- 30 mm polyp at 15 cm proximal to the anus, piecemeal resection-incomplete, invasive well-differentiated adenocarcinoma arising in a traditional serrated adenoma with high-grade dysplasia, invasive tumor at least 6 mm tumor budding score 1  CTs 08/07/2017- no colonic mass, no evidence of metastatic disease  Pelvic MRI 08/22/2017- short segment of wall thickening in the upper/mid rectum T1 or T2, N0, 9 cm from the anal sphincter  Low anterior resection 09/24/2017-previous polypectomy site with tattoo ink, negative for residual dysplasia or malignancy, negative margins, 16- lymph nodes  CTs 04/09/2018- no evidence of local recurrence or metastatic disease  2.   Elevated CEA 3.   Ongoing tobacco use 4.   History of heavy alcohol use 5.   Depression 6.   Family history of rectal cancer 7.   Rheumatoid  arthritis   Disposition:   Ms. Segers was diagnosed with stage I rectal can  Betsy Coder, MD  11/24/2018, 2:04 PM   Recommendations: 1.  Continue surveillance colonoscopy with Dr. Alessandra Bevels 2.  Check CEA in 3 months to assess for a consistent rise 3.  Check CEA approximately 2 months after discontinuing smoking, to confirm the elevated CEA is secondary to tobacco use 4.  Refer to lung cancer screening program 5.  Follow-up on mismatch repair protein testing on the June 2016 polypectomy to be sure she does not have hereditary non-polyposis colon cancer syndrome.  Ms. Kipnis was diagnosed with rectal cancer in June 2019.  She had an early stage tumor and has a good prognosis for long-term disease-free survival.  She is referred for evaluation of a mildly elevated CEA.  CEA was mildly elevated prior to surgery in July 2019 and is stable.  I suspect the CEA elevation is secondary to tobacco use.  There is no clinical evidence of rectal cancer  or another malignancy.  I do not recommend repeat CTs at present.  I recommend she discontinue smoking.  A CEA could be repeated a few months later to confirm the elevation is secondary to smoking.  She would like to see Dr. Drema Dallas for repeat CEA levels and ongoing clinical follow-up.  I recommend she continue a yearly screening chest CT.  We will make a referral to the lung cancer screening program.  Her father had rectal cancer.  We will follow-up on the mismatch repair protein testing on the June 2019 rectal biopsy to be sure she does not have hereditary non-polyposis colon cancer syndrome.  We will recommend her children and any siblings undergo screening for colorectal cancer.  Ms. Silveria is not scheduled for a follow-up appointment at the Cancer center.  I am available to see her in the future as needed.

## 2018-11-26 ENCOUNTER — Encounter: Payer: Self-pay | Admitting: *Deleted

## 2018-11-26 DIAGNOSIS — C20 Malignant neoplasm of rectum: Secondary | ICD-10-CM

## 2018-11-26 DIAGNOSIS — F17200 Nicotine dependence, unspecified, uncomplicated: Secondary | ICD-10-CM

## 2018-11-26 NOTE — Progress Notes (Signed)
Called and left VM with patient to advise of early colon screening for siblings and children and that referral for smoking cessation consult with Norton Blizzard RN.

## 2018-11-26 NOTE — Progress Notes (Signed)
Oncology Nurse Navigator Documentation  Oncology Nurse Navigator Flowsheets 11/26/2018  Navigator Location [facility]  Referral Date to RadOnc/MedOnc -  Navigator Encounter Type Other/I received a message that patient needs to be set up for the LDCT screening.  Order completed and I will update Eric Form NP with pulmonary team on referral.   Telephone -  Treatment Phase -  Barriers/Navigation Needs Coordination of Care  Interventions Coordination of Care  Coordination of Care Other  Education Method -  Acuity Level 2-Minimal Needs (1-2 Barriers Identified)  Acuity Level 2 -  Time Spent with Patient 15

## 2018-12-15 ENCOUNTER — Encounter: Payer: Self-pay | Admitting: *Deleted

## 2018-12-15 NOTE — Progress Notes (Signed)
Received fax drom Ala Dach, NP with lung cancer screening program. Patient is not eligible due to her rectal cancer diagnosis in 2019. Patient must be cancer free for 5 years prior to entering this program. MD notified.

## 2018-12-16 ENCOUNTER — Telehealth: Payer: Self-pay | Admitting: *Deleted

## 2018-12-16 NOTE — Telephone Encounter (Signed)
Notified that she does not qualify for the lung cancer screening program

## 2018-12-18 DIAGNOSIS — M0609 Rheumatoid arthritis without rheumatoid factor, multiple sites: Secondary | ICD-10-CM | POA: Diagnosis not present

## 2018-12-18 DIAGNOSIS — M8580 Other specified disorders of bone density and structure, unspecified site: Secondary | ICD-10-CM | POA: Diagnosis not present

## 2018-12-18 DIAGNOSIS — M130 Polyarthritis, unspecified: Secondary | ICD-10-CM | POA: Diagnosis not present

## 2018-12-18 DIAGNOSIS — Z79899 Other long term (current) drug therapy: Secondary | ICD-10-CM | POA: Diagnosis not present

## 2018-12-27 IMAGING — MR MR PELVIS WO/W CM
5 of 7 series · 30 of 48 positions shown · IV contrast (multihance)
Comparison: CT abdomen/pelvis dated 08/07/2017

CLINICAL DATA: Possible lesion on flexible sigmoidoscopy at
rectosigmoid junction

EXAM:
MRI PELVIS WITHOUT AND WITH CONTRAST
TECHNIQUE: Multiplanar multisequence MR imaging of the pelvis was performed
both before and after administration of intravenous contrast. Small
amount of US gel was administered per rectum to optimize tumor
evaluation.
CONTRAST:  10mL MULTIHANCE GADOBENATE DIMEGLUMINE 529 MG/ML IV SOLN

[Series 2: [id number]_sag · sagittal · 3.0mm · 0.47mm/px · 6 of 50 slices shown]
[im 1/50]
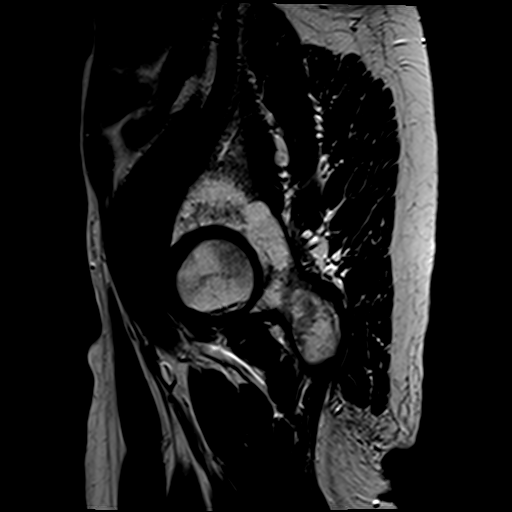
[im 10/50]
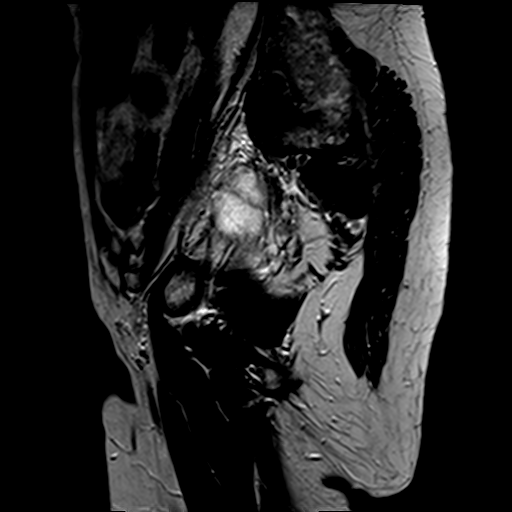
[im 20/50]
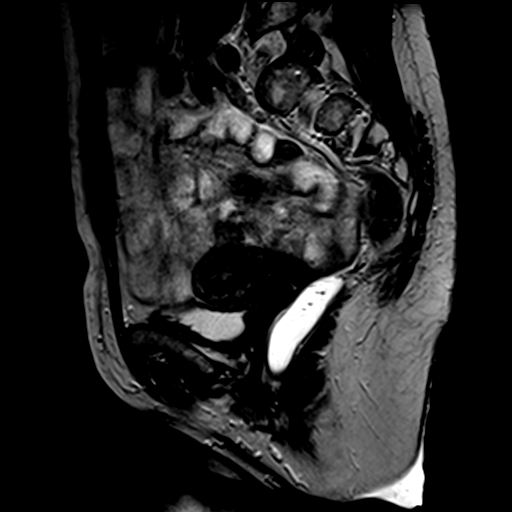
[im 30/50]
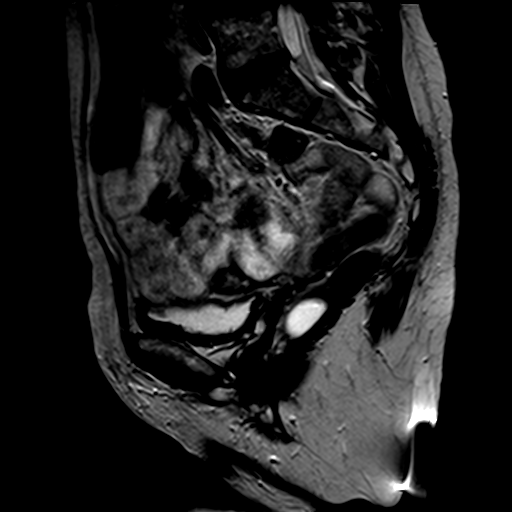
[im 40/50]
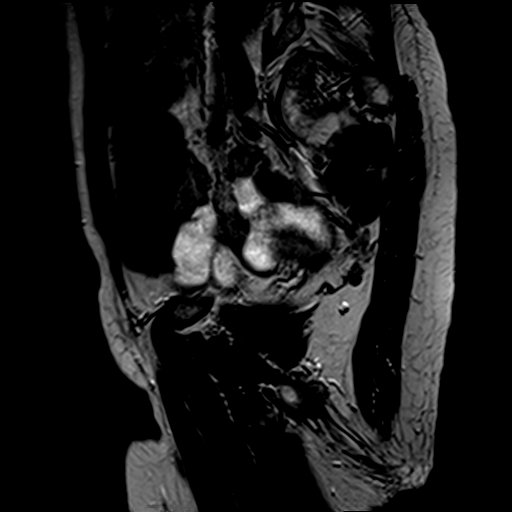
[im 50/50]
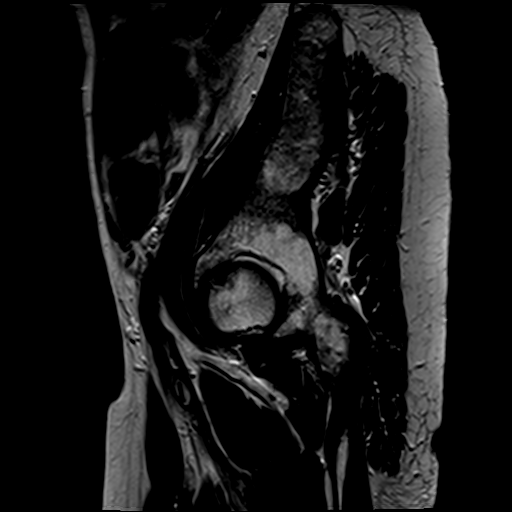

[Series 3: T1 fat-sat · axial · 5.0mm · 1.12mm/px · z∈[-113,+156]mm · 7 of 50 slices shown]
[im 1/50]
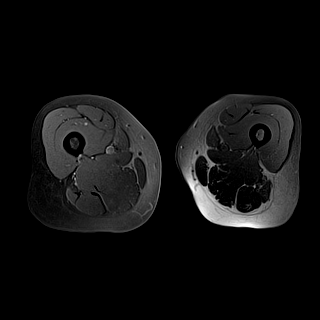
[im 9/50]
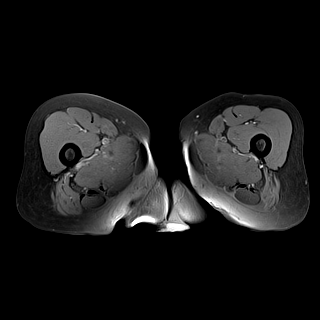
[im 17/50]
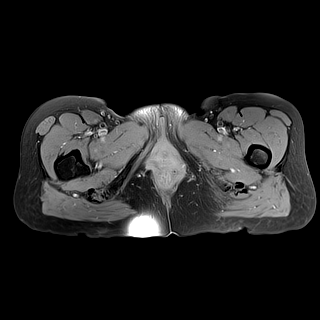
[im 25/50]
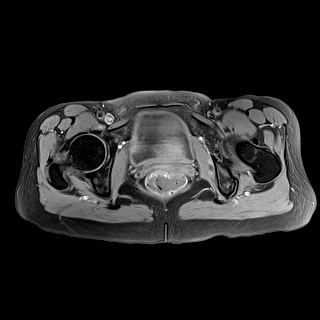
[im 33/50]
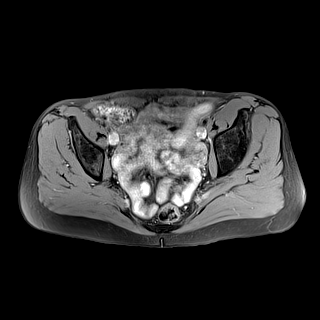
[im 41/50]
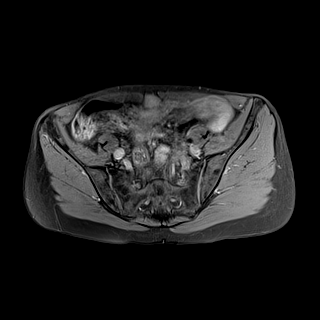
[im 50/50]
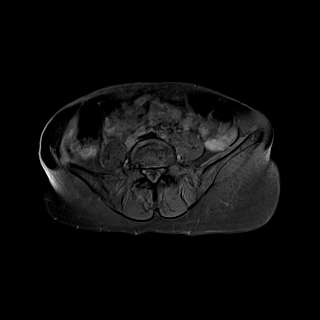

[Series 4: T2 · coronal · 3.0mm · 0.62mm/px · 7 of 50 slices shown (1 of 2)]
[im 1/50]
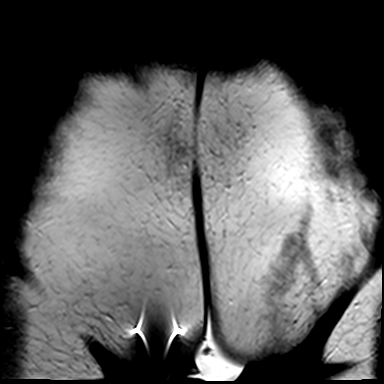
[im 9/50]
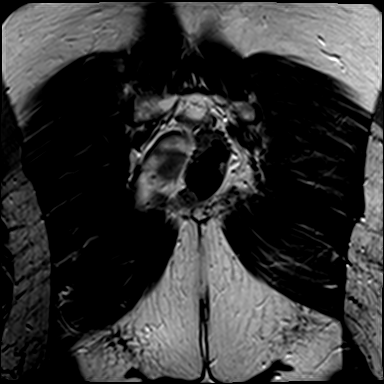
[im 17/50]
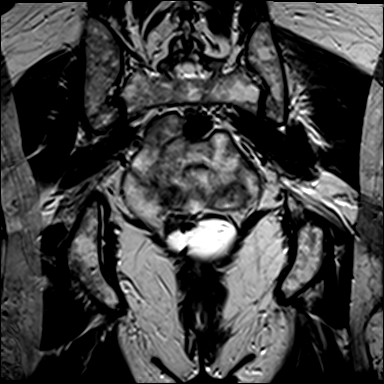
[im 25/50]
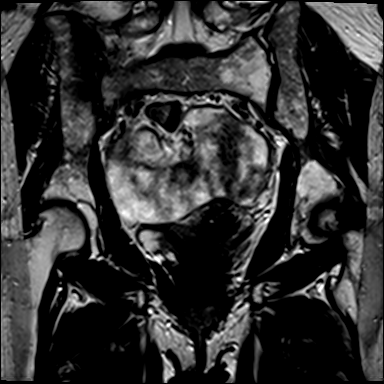
[im 33/50]
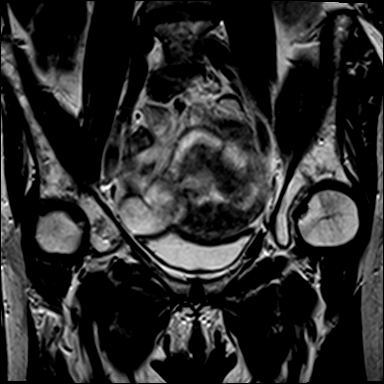
[im 41/50]
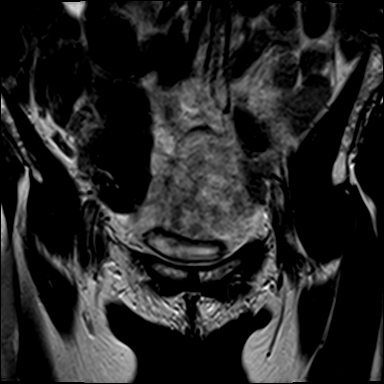
[im 50/50]
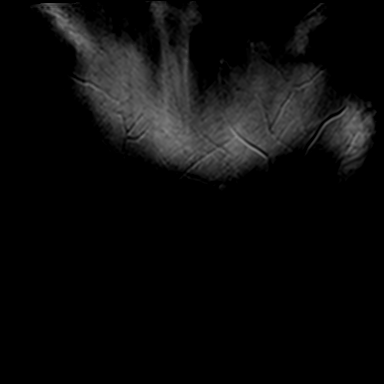

[Series 5: T2 fat-sat · axial · 5.0mm · 1.12mm/px · z∈[-113,+156]mm · 7 of 50 slices shown]
[im 1/50]
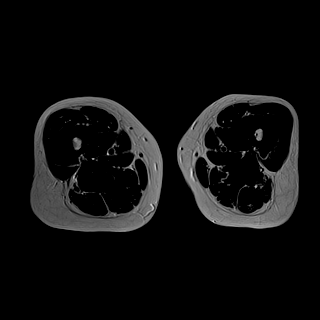
[im 9/50]
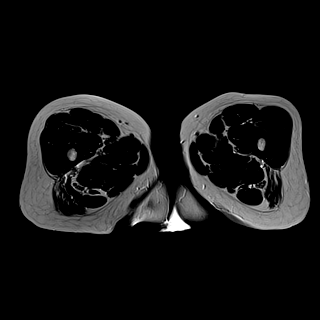
[im 17/50]
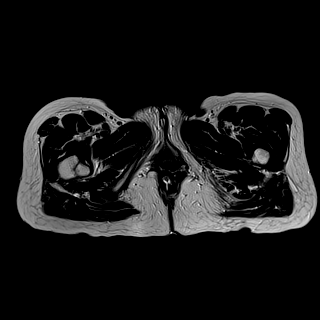
[im 25/50]
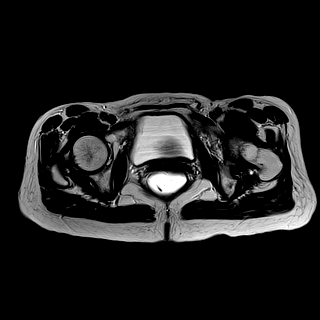
[im 33/50]
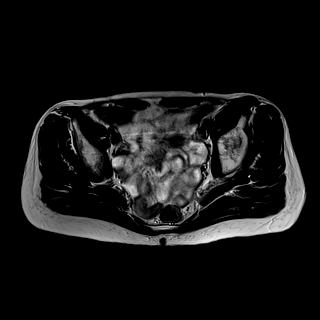
[im 41/50]
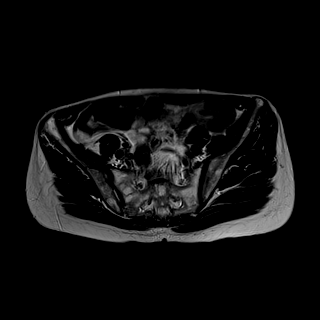
[im 50/50]
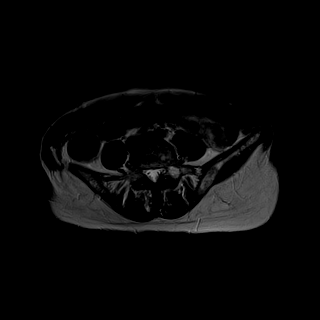

[Series 6: T2 · axial · 3.0mm · 0.47mm/px · z∈[-26,+24]mm · 3 of 54 slices shown (2 of 2)]
[im 1/54]
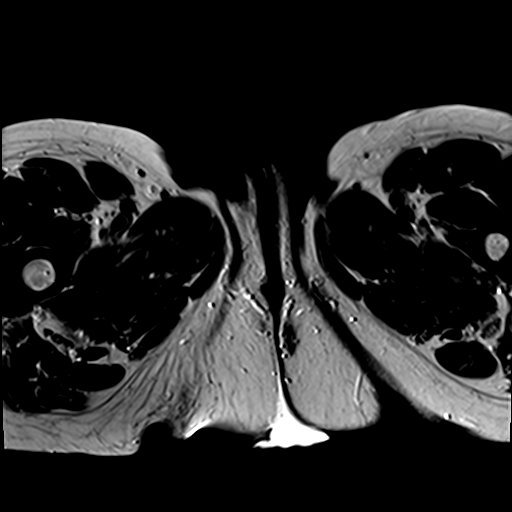
[im 9/54]
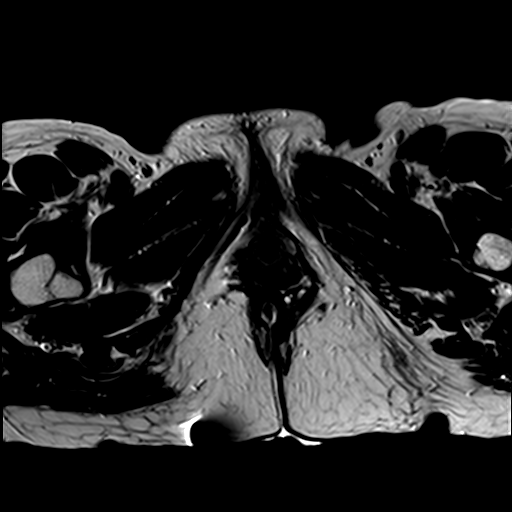
[im 18/54]
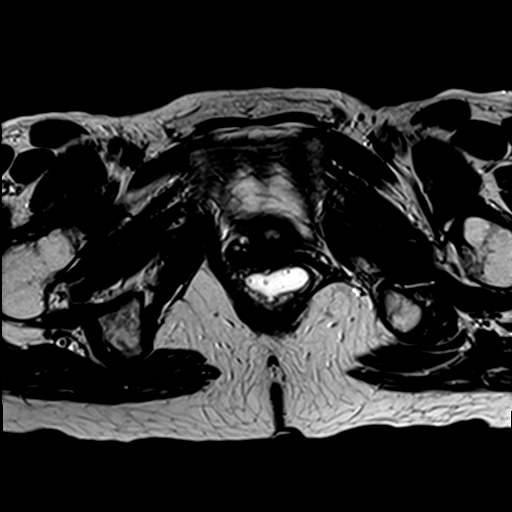

[30 of 48 positions shown; findings below may reference images not displayed]

FINDINGS: TUMOR LOCATION

Location from Anal Verge:  Mid 5-10cm

Shortest Distance from Tumor to Anal Sphincter:  9 cm

TUMOR DESCRIPTION

Circumferential Extent: Short segment circumferential (series
5/image 21)

Craniocaudal Extent:  3 cm

T - CATEGORY

Extension through Muscularis Propria:  No = T1 or T2

Maximum extension beyond Muscularis Propria:  0 mm

(early T3 = <5mm; advanced T3 = >5mm)

Extramural vascular invasion/tumor thrombus:  No

Shortest distance of any tumor/node from Mesorectal Fascia:  6 mm

Invasion of Anterior Peritoneal Reflection:  No

Involvement of Adjacent Organs or Pelvic Sidewall Structures:  No

N - CATEGORY

Mesorectal Lymph Nodes >=5mm:  No = N0

Extra-mesorectal Lymphadenopathy:  No

Other: None
IMPRESSION: Short segment circumferential wall thickening involving the
upper/mid rectum, likely corresponding to the patient's suspected
rectal cancer, as described above.

Rectal adenocarcinoma T stage:  T1 or T2

Rectal adenocarcinoma N stage:  N0

Distance from tumor to the anal sphincter is 9 cm.

## 2019-02-04 DIAGNOSIS — F339 Major depressive disorder, recurrent, unspecified: Secondary | ICD-10-CM | POA: Diagnosis not present

## 2019-02-04 DIAGNOSIS — B001 Herpesviral vesicular dermatitis: Secondary | ICD-10-CM | POA: Diagnosis not present

## 2019-02-04 DIAGNOSIS — M069 Rheumatoid arthritis, unspecified: Secondary | ICD-10-CM | POA: Diagnosis not present

## 2019-03-17 DIAGNOSIS — M79642 Pain in left hand: Secondary | ICD-10-CM | POA: Diagnosis not present

## 2019-03-17 DIAGNOSIS — M19042 Primary osteoarthritis, left hand: Secondary | ICD-10-CM | POA: Diagnosis not present

## 2019-03-17 DIAGNOSIS — M8580 Other specified disorders of bone density and structure, unspecified site: Secondary | ICD-10-CM | POA: Diagnosis not present

## 2019-03-17 DIAGNOSIS — M19041 Primary osteoarthritis, right hand: Secondary | ICD-10-CM | POA: Diagnosis not present

## 2019-03-17 DIAGNOSIS — M0609 Rheumatoid arthritis without rheumatoid factor, multiple sites: Secondary | ICD-10-CM | POA: Diagnosis not present

## 2019-03-17 DIAGNOSIS — Z79899 Other long term (current) drug therapy: Secondary | ICD-10-CM | POA: Diagnosis not present

## 2019-03-17 DIAGNOSIS — M79641 Pain in right hand: Secondary | ICD-10-CM | POA: Diagnosis not present

## 2019-03-17 DIAGNOSIS — M130 Polyarthritis, unspecified: Secondary | ICD-10-CM | POA: Diagnosis not present

## 2019-04-01 DIAGNOSIS — M0609 Rheumatoid arthritis without rheumatoid factor, multiple sites: Secondary | ICD-10-CM | POA: Diagnosis not present

## 2019-04-01 DIAGNOSIS — Z79899 Other long term (current) drug therapy: Secondary | ICD-10-CM | POA: Diagnosis not present

## 2019-04-07 DIAGNOSIS — M0609 Rheumatoid arthritis without rheumatoid factor, multiple sites: Secondary | ICD-10-CM | POA: Diagnosis not present

## 2019-04-09 DIAGNOSIS — F4321 Adjustment disorder with depressed mood: Secondary | ICD-10-CM | POA: Diagnosis not present

## 2019-04-09 DIAGNOSIS — F410 Panic disorder [episodic paroxysmal anxiety] without agoraphobia: Secondary | ICD-10-CM | POA: Diagnosis not present

## 2019-04-27 DIAGNOSIS — M0609 Rheumatoid arthritis without rheumatoid factor, multiple sites: Secondary | ICD-10-CM | POA: Diagnosis not present

## 2019-06-18 DIAGNOSIS — Z79899 Other long term (current) drug therapy: Secondary | ICD-10-CM | POA: Diagnosis not present

## 2019-06-18 DIAGNOSIS — M130 Polyarthritis, unspecified: Secondary | ICD-10-CM | POA: Diagnosis not present

## 2019-06-18 DIAGNOSIS — M0609 Rheumatoid arthritis without rheumatoid factor, multiple sites: Secondary | ICD-10-CM | POA: Diagnosis not present

## 2019-06-18 DIAGNOSIS — M8580 Other specified disorders of bone density and structure, unspecified site: Secondary | ICD-10-CM | POA: Diagnosis not present

## 2019-07-05 ENCOUNTER — Other Ambulatory Visit: Payer: Self-pay | Admitting: Family Medicine

## 2019-07-05 DIAGNOSIS — Z1231 Encounter for screening mammogram for malignant neoplasm of breast: Secondary | ICD-10-CM

## 2019-07-20 DIAGNOSIS — M0609 Rheumatoid arthritis without rheumatoid factor, multiple sites: Secondary | ICD-10-CM | POA: Diagnosis not present

## 2019-09-28 DIAGNOSIS — M0609 Rheumatoid arthritis without rheumatoid factor, multiple sites: Secondary | ICD-10-CM | POA: Diagnosis not present

## 2019-09-28 DIAGNOSIS — M8580 Other specified disorders of bone density and structure, unspecified site: Secondary | ICD-10-CM | POA: Diagnosis not present

## 2019-09-28 DIAGNOSIS — Z79899 Other long term (current) drug therapy: Secondary | ICD-10-CM | POA: Diagnosis not present

## 2019-09-28 DIAGNOSIS — Z85048 Personal history of other malignant neoplasm of rectum, rectosigmoid junction, and anus: Secondary | ICD-10-CM | POA: Diagnosis not present

## 2019-10-19 DIAGNOSIS — F410 Panic disorder [episodic paroxysmal anxiety] without agoraphobia: Secondary | ICD-10-CM | POA: Diagnosis not present

## 2019-10-19 DIAGNOSIS — F3342 Major depressive disorder, recurrent, in full remission: Secondary | ICD-10-CM | POA: Diagnosis not present

## 2019-11-23 DIAGNOSIS — M0609 Rheumatoid arthritis without rheumatoid factor, multiple sites: Secondary | ICD-10-CM | POA: Diagnosis not present

## 2019-12-08 DIAGNOSIS — M0609 Rheumatoid arthritis without rheumatoid factor, multiple sites: Secondary | ICD-10-CM | POA: Diagnosis not present

## 2020-01-04 DIAGNOSIS — R970 Elevated carcinoembryonic antigen [CEA]: Secondary | ICD-10-CM | POA: Diagnosis not present

## 2020-01-04 DIAGNOSIS — E7800 Pure hypercholesterolemia, unspecified: Secondary | ICD-10-CM | POA: Diagnosis not present

## 2020-01-04 DIAGNOSIS — M069 Rheumatoid arthritis, unspecified: Secondary | ICD-10-CM | POA: Diagnosis not present

## 2020-01-04 DIAGNOSIS — F339 Major depressive disorder, recurrent, unspecified: Secondary | ICD-10-CM | POA: Diagnosis not present

## 2020-01-04 DIAGNOSIS — D7589 Other specified diseases of blood and blood-forming organs: Secondary | ICD-10-CM | POA: Diagnosis not present

## 2020-01-07 ENCOUNTER — Other Ambulatory Visit: Payer: Self-pay | Admitting: Family Medicine

## 2020-01-07 DIAGNOSIS — J439 Emphysema, unspecified: Secondary | ICD-10-CM

## 2020-01-07 DIAGNOSIS — Z720 Tobacco use: Secondary | ICD-10-CM

## 2020-01-07 DIAGNOSIS — R911 Solitary pulmonary nodule: Secondary | ICD-10-CM

## 2020-01-27 DIAGNOSIS — Z23 Encounter for immunization: Secondary | ICD-10-CM | POA: Diagnosis not present

## 2020-01-27 DIAGNOSIS — M8580 Other specified disorders of bone density and structure, unspecified site: Secondary | ICD-10-CM | POA: Diagnosis not present

## 2020-01-27 DIAGNOSIS — Z79899 Other long term (current) drug therapy: Secondary | ICD-10-CM | POA: Diagnosis not present

## 2020-01-27 DIAGNOSIS — M0609 Rheumatoid arthritis without rheumatoid factor, multiple sites: Secondary | ICD-10-CM | POA: Diagnosis not present

## 2020-01-27 DIAGNOSIS — M79641 Pain in right hand: Secondary | ICD-10-CM | POA: Diagnosis not present

## 2020-01-31 ENCOUNTER — Other Ambulatory Visit: Payer: Self-pay | Admitting: Family Medicine

## 2020-01-31 DIAGNOSIS — Z720 Tobacco use: Secondary | ICD-10-CM

## 2020-02-01 ENCOUNTER — Ambulatory Visit
Admission: RE | Admit: 2020-02-01 | Discharge: 2020-02-01 | Disposition: A | Discharge status: Home / Self Care | Payer: BC Managed Care – PPO | Source: Ambulatory Visit | Attending: Family Medicine | Admitting: Family Medicine

## 2020-02-01 DIAGNOSIS — R059 Cough, unspecified: Secondary | ICD-10-CM | POA: Diagnosis not present

## 2020-02-01 DIAGNOSIS — Z720 Tobacco use: Secondary | ICD-10-CM

## 2020-02-09 ENCOUNTER — Telehealth: Payer: Self-pay

## 2020-02-09 NOTE — Telephone Encounter (Signed)
Pt is requesting interpretation of her CT Scan results from 02/01/20.

## 2020-02-11 ENCOUNTER — Telehealth: Payer: Self-pay | Admitting: *Deleted

## 2020-02-11 NOTE — Telephone Encounter (Signed)
Called patient w/CT chest results. Per Dr. Benay Spice: Shows no evidence of cancer. Needs to continue the weekly CT due to smoking history. She understands and agrees. Reminded her that is best to call the physician that ordered the scan for results in the future. Copy mailed to home per per request.

## 2020-04-13 DIAGNOSIS — F410 Panic disorder [episodic paroxysmal anxiety] without agoraphobia: Secondary | ICD-10-CM | POA: Diagnosis not present

## 2020-04-13 DIAGNOSIS — F3342 Major depressive disorder, recurrent, in full remission: Secondary | ICD-10-CM | POA: Diagnosis not present

## 2020-05-11 DIAGNOSIS — M0609 Rheumatoid arthritis without rheumatoid factor, multiple sites: Secondary | ICD-10-CM | POA: Diagnosis not present

## 2020-05-30 DIAGNOSIS — M0609 Rheumatoid arthritis without rheumatoid factor, multiple sites: Secondary | ICD-10-CM | POA: Diagnosis not present

## 2021-11-29 ENCOUNTER — Other Ambulatory Visit: Payer: Self-pay | Admitting: Family Medicine

## 2021-11-29 DIAGNOSIS — Z720 Tobacco use: Secondary | ICD-10-CM

## 2022-09-06 ENCOUNTER — Other Ambulatory Visit: Payer: Self-pay | Admitting: Family Medicine

## 2022-09-06 DIAGNOSIS — Z720 Tobacco use: Secondary | ICD-10-CM

## 2022-09-27 ENCOUNTER — Ambulatory Visit
Admission: RE | Admit: 2022-09-27 | Discharge: 2022-09-27 | Disposition: A | Discharge status: Home / Self Care | Payer: No Typology Code available for payment source | Source: Ambulatory Visit | Attending: Family Medicine | Admitting: Family Medicine

## 2022-09-27 DIAGNOSIS — Z720 Tobacco use: Secondary | ICD-10-CM

## 2022-12-05 ENCOUNTER — Other Ambulatory Visit: Payer: Self-pay | Admitting: Family Medicine

## 2022-12-05 DIAGNOSIS — E2839 Other primary ovarian failure: Secondary | ICD-10-CM
